# Patient Record
Sex: Male | Born: 1975 | Race: White | Hispanic: No | Marital: Single | Smoking: Current every day smoker
Health system: Southern US, Community
[De-identification: ages and names within clinical notes are randomized; demographics above are authoritative.]

## PROBLEM LIST (undated history)

## (undated) DIAGNOSIS — B182 Chronic viral hepatitis C: Secondary | ICD-10-CM

## (undated) HISTORY — PX: NO PAST SURGERIES: SHX2092

## (undated) HISTORY — DX: Chronic viral hepatitis C: B18.2

---

## 2000-06-03 ENCOUNTER — Emergency Department (HOSPITAL_COMMUNITY): Admission: EM | Admit: 2000-06-03 | Discharge: 2000-06-03 | Payer: Self-pay | Admitting: Emergency Medicine

## 2002-07-21 ENCOUNTER — Emergency Department (HOSPITAL_COMMUNITY): Admission: AC | Admit: 2002-07-21 | Discharge: 2002-07-21 | Payer: Self-pay

## 2003-11-30 ENCOUNTER — Emergency Department (HOSPITAL_COMMUNITY): Admission: EM | Admit: 2003-11-30 | Discharge: 2003-11-30 | Payer: Self-pay | Admitting: Emergency Medicine

## 2007-08-14 ENCOUNTER — Emergency Department (HOSPITAL_COMMUNITY): Admission: EM | Admit: 2007-08-14 | Discharge: 2007-08-14 | Payer: Self-pay | Admitting: Emergency Medicine

## 2010-05-21 ENCOUNTER — Emergency Department (HOSPITAL_COMMUNITY): Admission: EM | Admit: 2010-05-21 | Discharge: 2010-05-21 | Payer: Self-pay | Admitting: Emergency Medicine

## 2011-03-12 ENCOUNTER — Emergency Department (HOSPITAL_COMMUNITY): Payer: Self-pay

## 2011-03-12 ENCOUNTER — Emergency Department (HOSPITAL_COMMUNITY)
Admission: EM | Admit: 2011-03-12 | Discharge: 2011-03-12 | Disposition: A | Discharge status: Home / Self Care | Payer: No Typology Code available for payment source | Attending: Emergency Medicine | Admitting: Emergency Medicine

## 2011-03-12 DIAGNOSIS — F1010 Alcohol abuse, uncomplicated: Secondary | ICD-10-CM | POA: Insufficient documentation

## 2011-03-12 DIAGNOSIS — S0180XA Unspecified open wound of other part of head, initial encounter: Secondary | ICD-10-CM | POA: Insufficient documentation

## 2011-03-12 DIAGNOSIS — R51 Headache: Secondary | ICD-10-CM | POA: Insufficient documentation

## 2011-03-12 LAB — BASIC METABOLIC PANEL
Calcium: 9.3 mg/dL (ref 8.4–10.5)
GFR calc Af Amer: >60 mL/min (ref >60)
GFR calc non Af Amer: >60 mL/min (ref >60)
Potassium: 3.8 mEq/L (ref 3.5–5.1)
Sodium: 143 mEq/L (ref 135–145)

## 2011-03-12 LAB — CBC
HCT: 45.8 % (ref 39.0–52.0)
MCHC: 35.8 g/dL (ref 30.0–36.0)
RDW: 13 % (ref 11.5–15.5)
WBC: 24.5 10*3/uL — ABNORMAL HIGH (ref 4.0–10.5)

## 2011-03-12 LAB — ETHANOL: Alcohol, Ethyl (B): 215 mg/dL — ABNORMAL HIGH (ref 0–11)

## 2013-01-28 ENCOUNTER — Encounter (HOSPITAL_COMMUNITY): Payer: Self-pay | Admitting: Adult Health

## 2013-01-28 ENCOUNTER — Emergency Department (HOSPITAL_COMMUNITY)
Admission: EM | Admit: 2013-01-28 | Discharge: 2013-01-28 | Disposition: A | Discharge status: Home / Self Care | Payer: Self-pay | Attending: Emergency Medicine | Admitting: Emergency Medicine

## 2013-01-28 DIAGNOSIS — T24232A Burn of second degree of left lower leg, initial encounter: Secondary | ICD-10-CM

## 2013-01-28 DIAGNOSIS — F17200 Nicotine dependence, unspecified, uncomplicated: Secondary | ICD-10-CM | POA: Insufficient documentation

## 2013-01-28 DIAGNOSIS — T24131A Burn of first degree of right lower leg, initial encounter: Secondary | ICD-10-CM

## 2013-01-28 DIAGNOSIS — X19XXXA Contact with other heat and hot substances, initial encounter: Secondary | ICD-10-CM | POA: Insufficient documentation

## 2013-01-28 DIAGNOSIS — Y92009 Unspecified place in unspecified non-institutional (private) residence as the place of occurrence of the external cause: Secondary | ICD-10-CM | POA: Insufficient documentation

## 2013-01-28 DIAGNOSIS — Y939 Activity, unspecified: Secondary | ICD-10-CM | POA: Insufficient documentation

## 2013-01-28 DIAGNOSIS — T24239A Burn of second degree of unspecified lower leg, initial encounter: Secondary | ICD-10-CM | POA: Insufficient documentation

## 2013-01-28 MED ORDER — OXYCODONE-ACETAMINOPHEN 5-325 MG PO TABS
2.0000 | ORAL_TABLET | Freq: Once | ORAL | Status: AC
  Administered 2013-01-28: 2 via ORAL
  Filled 2013-01-28: qty 2

## 2013-01-28 MED ORDER — OXYCODONE-ACETAMINOPHEN 5-325 MG PO TABS: 1.0000 | ORAL_TABLET | ORAL | Status: DC | PRN
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
## 2013-01-30 NOTE — ED Provider Notes (Signed)
Medical screening examination/treatment/procedure(s) were performed by non-physician practitioner and as supervising physician I was immediately available for consultation/collaboration.   Anterrio Mccleery W. Srihith Aquilino, MD 01/30/13 0744 

## 2013-11-30 ENCOUNTER — Emergency Department (HOSPITAL_COMMUNITY)
Admission: EM | Admit: 2013-11-30 | Discharge: 2013-11-30 | Disposition: A | Discharge status: Home / Self Care | Payer: Self-pay | Attending: Emergency Medicine | Admitting: Emergency Medicine

## 2013-11-30 ENCOUNTER — Emergency Department (HOSPITAL_COMMUNITY): Payer: Self-pay

## 2013-11-30 DIAGNOSIS — D72829 Elevated white blood cell count, unspecified: Secondary | ICD-10-CM | POA: Insufficient documentation

## 2013-11-30 DIAGNOSIS — F17200 Nicotine dependence, unspecified, uncomplicated: Secondary | ICD-10-CM | POA: Insufficient documentation

## 2013-11-30 DIAGNOSIS — R7402 Elevation of levels of lactic acid dehydrogenase (LDH): Secondary | ICD-10-CM | POA: Insufficient documentation

## 2013-11-30 DIAGNOSIS — R7401 Elevation of levels of liver transaminase levels: Secondary | ICD-10-CM | POA: Insufficient documentation

## 2013-11-30 DIAGNOSIS — Y9389 Activity, other specified: Secondary | ICD-10-CM | POA: Insufficient documentation

## 2013-11-30 DIAGNOSIS — Z23 Encounter for immunization: Secondary | ICD-10-CM | POA: Insufficient documentation

## 2013-11-30 DIAGNOSIS — L02419 Cutaneous abscess of limb, unspecified: Secondary | ICD-10-CM | POA: Insufficient documentation

## 2013-11-30 DIAGNOSIS — IMO0002 Reserved for concepts with insufficient information to code with codable children: Secondary | ICD-10-CM | POA: Insufficient documentation

## 2013-11-30 DIAGNOSIS — L03116 Cellulitis of left lower limb: Secondary | ICD-10-CM

## 2013-11-30 DIAGNOSIS — L03119 Cellulitis of unspecified part of limb: Secondary | ICD-10-CM

## 2013-11-30 DIAGNOSIS — R000 Tachycardia, unspecified: Secondary | ICD-10-CM | POA: Insufficient documentation

## 2013-11-30 DIAGNOSIS — Y9289 Other specified places as the place of occurrence of the external cause: Secondary | ICD-10-CM | POA: Insufficient documentation

## 2013-11-30 DIAGNOSIS — R740 Nonspecific elevation of levels of transaminase and lactic acid dehydrogenase [LDH]: Secondary | ICD-10-CM

## 2013-11-30 DIAGNOSIS — W2203XA Walked into furniture, initial encounter: Secondary | ICD-10-CM | POA: Insufficient documentation

## 2013-11-30 LAB — BASIC METABOLIC PANEL
BUN: 9 mg/dL (ref 6–23)
CALCIUM: 9 mg/dL (ref 8.4–10.5)
CO2: 21 meq/L (ref 19–32)
CREATININE: 1.21 mg/dL (ref 0.50–1.35)
Chloride: 103 mEq/L (ref 96–112)
GFR calc Af Amer: 86 mL/min — ABNORMAL LOW (ref >90)
GFR calc non Af Amer: 74 mL/min — ABNORMAL LOW (ref >90)
Glucose, Bld: 110 mg/dL — ABNORMAL HIGH (ref 70–99)
Potassium: 3.8 mEq/L (ref 3.7–5.3)
Sodium: 142 mEq/L (ref 137–147)

## 2013-11-30 LAB — CBC WITH DIFFERENTIAL/PLATELET
BASOS ABS: 0 10*3/uL (ref 0.0–0.1)
BASOS PCT: 0 % (ref 0–1)
EOS PCT: 2 % (ref 0–5)
Eosinophils Absolute: 0.2 10*3/uL (ref 0.0–0.7)
HEMATOCRIT: 45.8 % (ref 39.0–52.0)
Hemoglobin: 16.3 g/dL (ref 13.0–17.0)
Lymphocytes Relative: 13 % (ref 12–46)
Lymphs Abs: 1.6 10*3/uL (ref 0.7–4.0)
MCH: 34.3 pg — ABNORMAL HIGH (ref 26.0–34.0)
MCHC: 35.6 g/dL (ref 30.0–36.0)
MCV: 96.4 fL (ref 78.0–100.0)
MONO ABS: 1 10*3/uL (ref 0.1–1.0)
Monocytes Relative: 8 % (ref 3–12)
Neutro Abs: 9.1 10*3/uL — ABNORMAL HIGH (ref 1.7–7.7)
Neutrophils Relative %: 77 % (ref 43–77)
Platelets: 306 10*3/uL (ref 150–400)
RBC: 4.75 MIL/uL (ref 4.22–5.81)
RDW: 12.5 % (ref 11.5–15.5)
WBC: 11.9 10*3/uL — ABNORMAL HIGH (ref 4.0–10.5)

## 2013-11-30 LAB — I-STAT CG4 LACTIC ACID, ED: Lactic Acid, Venous: 2.7 mmol/L — ABNORMAL HIGH (ref 0.5–2.2)

## 2013-11-30 MED ORDER — SODIUM CHLORIDE 0.9 % IV BOLUS (SEPSIS)
1000.0000 mL | Freq: Once | INTRAVENOUS | Status: AC
  Administered 2013-11-30: 1000 mL via INTRAVENOUS

## 2013-11-30 MED ORDER — CLINDAMYCIN HCL 150 MG PO CAPS: 300.0000 mg | ORAL_CAPSULE | Freq: Four times a day (QID) | ORAL | Status: DC

## 2013-11-30 MED ORDER — TETANUS-DIPHTH-ACELL PERTUSSIS 5-2.5-18.5 LF-MCG/0.5 IM SUSP
0.5000 mL | Freq: Once | INTRAMUSCULAR | Status: AC
  Administered 2013-11-30: 0.5 mL via INTRAMUSCULAR
  Filled 2013-11-30: qty 0.5

## 2013-11-30 MED ORDER — GI COCKTAIL ~~LOC~~
30.0000 mL | Freq: Once | ORAL | Status: AC
  Administered 2013-11-30: 30 mL via ORAL
  Filled 2013-11-30: qty 30

## 2013-11-30 MED ORDER — CLINDAMYCIN PHOSPHATE 600 MG/50ML IV SOLN
600.0000 mg | Freq: Once | INTRAVENOUS | Status: AC
  Administered 2013-11-30: 600 mg via INTRAVENOUS
  Filled 2013-11-30: qty 50

## 2013-11-30 MED ORDER — OXYCODONE-ACETAMINOPHEN 5-325 MG PO TABS: 2.0000 | ORAL_TABLET | ORAL | Status: DC | PRN

## 2013-11-30 MED ORDER — MORPHINE SULFATE 4 MG/ML IJ SOLN
4.0000 mg | Freq: Once | INTRAMUSCULAR | Status: AC
  Administered 2013-11-30: 4 mg via INTRAVENOUS
  Filled 2013-11-30: qty 1

## 2013-11-30 NOTE — ED Notes (Signed)
Pt reports left lower leg pain. Pt presents with wound on lower left leg that happened a week ago. Wound is red and warm and tender to touch. Pt A&Ox4, respirations equal and unlabored, skin warm and dry
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
## 2013-11-30 NOTE — Discharge Instructions (Signed)
Return to a doctor in 2 days for recheck of your wound.  Return sooner for signs of worsening infection:  Fever, increasing redness, drainage of pus, or pain uncontrolled with medications   Cellulitis Cellulitis is an infection of the skin and the tissue beneath it. The infected area is usually red and tender. Cellulitis occurs most often in the arms and lower legs.  CAUSES  Cellulitis is caused by bacteria that enter the skin through cracks or cuts in the skin. The most common types of bacteria that cause cellulitis are Staphylococcus and Streptococcus. SYMPTOMS   Redness and warmth.  Swelling.  Tenderness or pain.  Fever. DIAGNOSIS  Your caregiver can usually determine what is wrong based on a physical exam. Blood tests may also be done. TREATMENT  Treatment usually involves taking an antibiotic medicine. HOME CARE INSTRUCTIONS   Take your antibiotics as directed. Finish them even if you start to feel better.  Keep the infected arm or leg elevated to reduce swelling.  Apply a warm cloth to the affected area up to 4 times per day to relieve pain.  Only take over-the-counter or prescription medicines for pain, discomfort, or fever as directed by your caregiver.  Keep all follow-up appointments as directed by your caregiver. SEEK MEDICAL CARE IF:   You notice red streaks coming from the infected area.  Your red area gets larger or turns dark in color.  Your bone or joint underneath the infected area becomes painful after the skin has healed.  Your infection returns in the same area or another area.  You notice a swollen bump in the infected area.  You develop new symptoms. SEEK IMMEDIATE MEDICAL CARE IF:   You have a fever.  You feel very sleepy.  You develop vomiting or diarrhea.  You have a general ill feeling (malaise) with muscle aches and pains. MAKE SURE YOU:   Understand these instructions.  Will watch your condition.  Will get help right away if you  are not doing well or get worse. Document Released: 05/05/2005 Document Revised: 01/25/2012 Document Reviewed: 10/11/2011 [facility] Patient Information 2014 [employer], [place].

## 2013-12-06 LAB — CULTURE, BLOOD (ROUTINE X 2)
CULTURE: NO GROWTH
Culture: NO GROWTH

## 2016-09-27 ENCOUNTER — Other Ambulatory Visit: Payer: Self-pay | Admitting: Family Medicine

## 2016-09-27 ENCOUNTER — Ambulatory Visit
Admission: RE | Admit: 2016-09-27 | Discharge: 2016-09-27 | Disposition: A | Discharge status: Home / Self Care | Payer: BLUE CROSS/BLUE SHIELD | Source: Ambulatory Visit | Attending: Family Medicine | Admitting: Family Medicine

## 2016-09-27 DIAGNOSIS — R0789 Other chest pain: Secondary | ICD-10-CM

## 2017-01-04 ENCOUNTER — Other Ambulatory Visit: Payer: Self-pay | Admitting: Family Medicine

## 2017-01-04 DIAGNOSIS — R17 Unspecified jaundice: Secondary | ICD-10-CM

## 2017-01-04 DIAGNOSIS — R110 Nausea: Secondary | ICD-10-CM

## 2017-01-07 ENCOUNTER — Emergency Department (HOSPITAL_COMMUNITY)
Admission: EM | Admit: 2017-01-07 | Discharge: 2017-01-07 | Discharge status: Against Medical Advice | Payer: BLUE CROSS/BLUE SHIELD | Attending: Emergency Medicine | Admitting: Emergency Medicine

## 2017-01-07 ENCOUNTER — Encounter (HOSPITAL_COMMUNITY): Payer: Self-pay

## 2017-01-07 DIAGNOSIS — R1011 Right upper quadrant pain: Secondary | ICD-10-CM | POA: Diagnosis present

## 2017-01-07 LAB — COMPREHENSIVE METABOLIC PANEL
ALBUMIN: 3.2 g/dL — AB (ref 3.5–5.0)
ALK PHOS: 146 U/L — AB (ref 38–126)
ALT: 1456 U/L — AB (ref 17–63)
AST: 1148 U/L — AB (ref 15–41)
Anion gap: 8 (ref 5–15)
BUN: 6 mg/dL (ref 6–20)
CALCIUM: 8.8 mg/dL — AB (ref 8.9–10.3)
CO2: 25 mmol/L (ref 22–32)
CREATININE: 0.91 mg/dL (ref 0.61–1.24)
Chloride: 104 mmol/L (ref 101–111)
GFR calc non Af Amer: >60 mL/min (ref >60)
GLUCOSE: 178 mg/dL — AB (ref 65–99)
Potassium: 4.3 mmol/L (ref 3.5–5.1)
SODIUM: 137 mmol/L (ref 135–145)
Total Bilirubin: 12.1 mg/dL — ABNORMAL HIGH (ref 0.3–1.2)
Total Protein: 6.1 g/dL — ABNORMAL LOW (ref 6.5–8.1)

## 2017-01-07 LAB — CBC
HCT: 42.9 % (ref 39.0–52.0)
Hemoglobin: 14.5 g/dL (ref 13.0–17.0)
MCH: 30.6 pg (ref 26.0–34.0)
MCHC: 33.8 g/dL (ref 30.0–36.0)
MCV: 90.5 fL (ref 78.0–100.0)
PLATELETS: 265 10*3/uL (ref 150–400)
RBC: 4.74 MIL/uL (ref 4.22–5.81)
RDW: 15.6 % — ABNORMAL HIGH (ref 11.5–15.5)
WBC: 6.8 10*3/uL (ref 4.0–10.5)

## 2017-01-07 LAB — LIPASE, BLOOD: Lipase: 33 U/L (ref 11–51)

## 2017-01-07 NOTE — ED Triage Notes (Signed)
Pt complaining of R upper abdominal pain. Pt states seen by PCP, told having gallbladder issues. Told to come to ED. Pt states ongoing x months. Pt denies any new/worsening symtpoms today.

## 2017-01-07 NOTE — ED Notes (Signed)
Called pt to possibly give urine sample and no one answered.

## 2017-01-13 ENCOUNTER — Other Ambulatory Visit: Payer: BLUE CROSS/BLUE SHIELD

## 2019-02-16 ENCOUNTER — Other Ambulatory Visit: Payer: Self-pay

## 2019-02-16 ENCOUNTER — Emergency Department (HOSPITAL_COMMUNITY)
Admission: EM | Admit: 2019-02-16 | Discharge: 2019-02-16 | Disposition: A | Discharge status: Home / Self Care | Payer: BLUE CROSS/BLUE SHIELD | Attending: Emergency Medicine | Admitting: Emergency Medicine

## 2019-02-16 ENCOUNTER — Encounter (HOSPITAL_COMMUNITY): Payer: Self-pay

## 2019-02-16 DIAGNOSIS — F17210 Nicotine dependence, cigarettes, uncomplicated: Secondary | ICD-10-CM | POA: Insufficient documentation

## 2019-02-16 DIAGNOSIS — F17220 Nicotine dependence, chewing tobacco, uncomplicated: Secondary | ICD-10-CM | POA: Insufficient documentation

## 2019-02-16 DIAGNOSIS — F1910 Other psychoactive substance abuse, uncomplicated: Secondary | ICD-10-CM

## 2019-02-16 NOTE — ED Notes (Signed)
Patient is dressing out in scrubs.

## 2019-02-16 NOTE — ED Notes (Signed)
Patient given sandwich and soda. 

## 2019-02-16 NOTE — ED Notes (Signed)
Patient discharge and resources provided to patient.
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
## 2019-03-13 ENCOUNTER — Emergency Department (HOSPITAL_COMMUNITY)
Admission: EM | Admit: 2019-03-13 | Discharge: 2019-03-13 | Disposition: A | Discharge status: Home / Self Care | Payer: Self-pay | Attending: Emergency Medicine | Admitting: Emergency Medicine

## 2019-03-13 ENCOUNTER — Encounter (HOSPITAL_COMMUNITY): Payer: Self-pay | Admitting: Emergency Medicine

## 2019-03-13 ENCOUNTER — Emergency Department (HOSPITAL_COMMUNITY): Payer: Self-pay

## 2019-03-13 ENCOUNTER — Other Ambulatory Visit: Payer: Self-pay

## 2019-03-13 DIAGNOSIS — F1910 Other psychoactive substance abuse, uncomplicated: Secondary | ICD-10-CM

## 2019-03-13 DIAGNOSIS — T41294A Poisoning by other general anesthetics, undetermined, initial encounter: Secondary | ICD-10-CM | POA: Insufficient documentation

## 2019-03-13 DIAGNOSIS — R000 Tachycardia, unspecified: Secondary | ICD-10-CM | POA: Insufficient documentation

## 2019-03-13 DIAGNOSIS — Z20828 Contact with and (suspected) exposure to other viral communicable diseases: Secondary | ICD-10-CM | POA: Insufficient documentation

## 2019-03-13 DIAGNOSIS — F17210 Nicotine dependence, cigarettes, uncomplicated: Secondary | ICD-10-CM | POA: Insufficient documentation

## 2019-03-13 DIAGNOSIS — T50904A Poisoning by unspecified drugs, medicaments and biological substances, undetermined, initial encounter: Secondary | ICD-10-CM

## 2019-03-13 DIAGNOSIS — R456 Violent behavior: Secondary | ICD-10-CM | POA: Insufficient documentation

## 2019-03-13 DIAGNOSIS — R451 Restlessness and agitation: Secondary | ICD-10-CM | POA: Insufficient documentation

## 2019-03-13 LAB — CBC WITH DIFFERENTIAL/PLATELET
Abs Immature Granulocytes: 0.14 10*3/uL — ABNORMAL HIGH (ref 0.00–0.07)
Basophils Absolute: 0.1 10*3/uL (ref 0.0–0.1)
Basophils Relative: 1 %
Eosinophils Absolute: 0.3 10*3/uL (ref 0.0–0.5)
Eosinophils Relative: 2 %
HCT: 46.3 % (ref 39.0–52.0)
Hemoglobin: 15.5 g/dL (ref 13.0–17.0)
Immature Granulocytes: 1 %
Lymphocytes Relative: 14 %
Lymphs Abs: 1.8 10*3/uL (ref 0.7–4.0)
MCH: 31.5 pg (ref 26.0–34.0)
MCHC: 33.5 g/dL (ref 30.0–36.0)
MCV: 94.1 fL (ref 80.0–100.0)
Monocytes Absolute: 1 10*3/uL (ref 0.1–1.0)
Monocytes Relative: 8 %
Neutro Abs: 10 10*3/uL — ABNORMAL HIGH (ref 1.7–7.7)
Neutrophils Relative %: 74 %
Platelets: 317 10*3/uL (ref 150–400)
RBC: 4.92 MIL/uL (ref 4.22–5.81)
RDW: 12.8 % (ref 11.5–15.5)
WBC: 13.3 10*3/uL — ABNORMAL HIGH (ref 4.0–10.5)
nRBC: 0 % (ref 0.0–0.2)

## 2019-03-13 LAB — RAPID URINE DRUG SCREEN, HOSP PERFORMED
Amphetamines: POSITIVE — AB
Barbiturates: NOT DETECTED
Benzodiazepines: POSITIVE — AB
Cocaine: POSITIVE — AB
Opiates: NOT DETECTED
Tetrahydrocannabinol: NOT DETECTED

## 2019-03-13 LAB — I-STAT CHEM 8, ED
BUN: 15 mg/dL (ref 6–20)
Calcium, Ion: 1.1 mmol/L — ABNORMAL LOW (ref 1.15–1.40)
Chloride: 108 mmol/L (ref 98–111)
Creatinine, Ser: 1.2 mg/dL (ref 0.61–1.24)
Glucose, Bld: 98 mg/dL (ref 70–99)
HCT: 46 % (ref 39.0–52.0)
Hemoglobin: 15.6 g/dL (ref 13.0–17.0)
Potassium: 3.8 mmol/L (ref 3.5–5.1)
Sodium: 142 mmol/L (ref 135–145)
TCO2: 23 mmol/L (ref 22–32)

## 2019-03-13 LAB — COMPREHENSIVE METABOLIC PANEL
ALT: 59 U/L — ABNORMAL HIGH (ref 0–44)
AST: 55 U/L — ABNORMAL HIGH (ref 15–41)
Albumin: 4.8 g/dL (ref 3.5–5.0)
Alkaline Phosphatase: 64 U/L (ref 38–126)
Anion gap: 15 (ref 5–15)
BUN: 14 mg/dL (ref 6–20)
CO2: 23 mmol/L (ref 22–32)
Calcium: 9.6 mg/dL (ref 8.9–10.3)
Chloride: 104 mmol/L (ref 98–111)
Creatinine, Ser: 1.42 mg/dL — ABNORMAL HIGH (ref 0.61–1.24)
GFR calc Af Amer: >60 mL/min (ref >60)
GFR calc non Af Amer: 59 mL/min — ABNORMAL LOW (ref >60)
Glucose, Bld: 93 mg/dL (ref 70–99)
Potassium: 4.1 mmol/L (ref 3.5–5.1)
Sodium: 142 mmol/L (ref 135–145)
Total Bilirubin: 1.5 mg/dL — ABNORMAL HIGH (ref 0.3–1.2)
Total Protein: 7.9 g/dL (ref 6.5–8.1)

## 2019-03-13 LAB — SALICYLATE LEVEL: Salicylate Lvl: <7 mg/dL (ref 2.8–30.0)

## 2019-03-13 LAB — SARS CORONAVIRUS 2 BY RT PCR (HOSPITAL ORDER, PERFORMED IN ~~LOC~~ HOSPITAL LAB): SARS Coronavirus 2: NEGATIVE

## 2019-03-13 LAB — ACETAMINOPHEN LEVEL: Acetaminophen (Tylenol), Serum: <10 ug/mL — ABNORMAL LOW (ref 10–30)

## 2019-03-13 LAB — CBG MONITORING, ED: Glucose-Capillary: 97 mg/dL (ref 70–99)

## 2019-03-13 LAB — ETHANOL: Alcohol, Ethyl (B): <10 mg/dL (ref <10)

## 2019-03-13 LAB — CK: Total CK: 613 U/L — ABNORMAL HIGH (ref 49–397)

## 2019-03-13 MED ORDER — SODIUM CHLORIDE 0.9 % IV SOLN
INTRAVENOUS | Status: DC
  Administered 2019-03-13: 06:00:00 via INTRAVENOUS

## 2019-03-13 MED ORDER — LORAZEPAM 2 MG/ML IJ SOLN
2.0000 mg | Freq: Once | INTRAMUSCULAR | Status: AC
  Administered 2019-03-13: 06:00:00 2 mg via INTRAVENOUS

## 2019-03-13 MED ORDER — DIPHENHYDRAMINE HCL 50 MG/ML IJ SOLN
INTRAMUSCULAR | Status: AC
  Filled 2019-03-13: qty 1

## 2019-03-13 MED ORDER — LORAZEPAM 2 MG/ML IJ SOLN
INTRAMUSCULAR | Status: AC
  Filled 2019-03-13: qty 1

## 2019-03-13 MED ORDER — SODIUM CHLORIDE 0.9 % IV BOLUS
1000.0000 mL | Freq: Once | INTRAVENOUS | Status: AC
  Administered 2019-03-13: 06:00:00 1000 mL via INTRAVENOUS

## 2019-03-13 MED ORDER — STERILE WATER FOR INJECTION IJ SOLN
INTRAMUSCULAR | Status: AC
  Administered 2019-03-13: 06:00:00
  Filled 2019-03-13: qty 10

## 2019-03-13 MED ORDER — THIAMINE HCL 100 MG/ML IJ SOLN
Freq: Once | INTRAVENOUS | Status: AC
  Administered 2019-03-13: 08:00:00 via INTRAVENOUS
  Filled 2019-03-13: qty 1000

## 2019-03-13 MED ORDER — DIPHENHYDRAMINE HCL 50 MG/ML IJ SOLN
25.0000 mg | Freq: Once | INTRAMUSCULAR | Status: AC
  Administered 2019-03-13: 06:00:00 25 mg via INTRAVENOUS

## 2019-03-13 MED ORDER — ZIPRASIDONE MESYLATE 20 MG IM SOLR
20.0000 mg | Freq: Once | INTRAMUSCULAR | Status: AC
  Administered 2019-03-13: 20 mg via INTRAMUSCULAR

## 2019-03-13 NOTE — ED Notes (Signed)
Pharmacy notified to send pt.'s IV fluid order.
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
## 2019-03-13 NOTE — ED Notes (Signed)
Pt verbalized discharge instructions and the need for substance abuse assistance. Pt is alert and oriented X4. IV removed and bleeding controlled. Attempted to call parents for ride, could not reach them. Pt stated he prefers to walk home. Pt has steady gait and ambulated to lobby with no issues.

## 2019-03-13 NOTE — ED Provider Notes (Signed)
Pt signed out by Dr. Gustavus Messing.  He has been asleep all day.  He is now awake and alert.  He denies si or hi.  He is ready to go home.  His vitals are good.  He is stable for d/c.  Return if worse.   Isla Pence, MD 03/13/19 [phone]

## 2019-03-13 NOTE — ED Notes (Signed)
Pt sitting up in bed and moaning. Easily redirectable and following simple commands at this time. Eyes closed and not speaking.

## 2019-03-13 NOTE — ED Notes (Signed)
Patient transported to CT scan . 

## 2019-03-13 NOTE — ED Provider Notes (Signed)
7:29 AM Care assumed from Dr. Randal Buba.  At time of transfer care, patient is awaiting results of diagnostic labs and imaging.  Patient presented for excited delirium likely diagnosis of multiple drugs.  Patient was pacified with Geodon, Ativan, Benadryl overnight and was not intubated.  Plan of care will be to wait for the work-up results of his labs and imaging and then when the patient wakes like, reassess him for SI/HI/or other complaints.  CK has returned over 600, he is currently getting fluids and received a fluid bolus already.  Patient continue to rest for several hours.  On my reassessment he is still somnolent.  He will be allowed to metabolize further.  Care transferred to oncoming team to await reassessment.  Anticipate discharge after rehydration and reassessment.     Tegeler, Gwenyth Allegra, MD 03/13/19 [phone]

## 2019-03-13 NOTE — ED Notes (Signed)
Spoke to pt mother. Gave update.
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
## 2019-03-26 ENCOUNTER — Other Ambulatory Visit: Payer: Self-pay

## 2019-03-26 ENCOUNTER — Emergency Department (HOSPITAL_COMMUNITY)
Admission: EM | Admit: 2019-03-26 | Discharge: 2019-03-26 | Disposition: A | Discharge status: Home / Self Care | Payer: Self-pay | Attending: Emergency Medicine | Admitting: Emergency Medicine

## 2019-03-26 ENCOUNTER — Encounter (HOSPITAL_COMMUNITY): Payer: Self-pay

## 2019-03-26 DIAGNOSIS — Y929 Unspecified place or not applicable: Secondary | ICD-10-CM | POA: Insufficient documentation

## 2019-03-26 DIAGNOSIS — F22 Delusional disorders: Secondary | ICD-10-CM | POA: Insufficient documentation

## 2019-03-26 DIAGNOSIS — W25XXXA Contact with sharp glass, initial encounter: Secondary | ICD-10-CM | POA: Insufficient documentation

## 2019-03-26 DIAGNOSIS — F1910 Other psychoactive substance abuse, uncomplicated: Secondary | ICD-10-CM | POA: Insufficient documentation

## 2019-03-26 DIAGNOSIS — F419 Anxiety disorder, unspecified: Secondary | ICD-10-CM | POA: Insufficient documentation

## 2019-03-26 DIAGNOSIS — Y999 Unspecified external cause status: Secondary | ICD-10-CM | POA: Insufficient documentation

## 2019-03-26 DIAGNOSIS — T07XXXA Unspecified multiple injuries, initial encounter: Secondary | ICD-10-CM | POA: Insufficient documentation

## 2019-03-26 DIAGNOSIS — F17210 Nicotine dependence, cigarettes, uncomplicated: Secondary | ICD-10-CM | POA: Insufficient documentation

## 2019-03-26 DIAGNOSIS — Z046 Encounter for general psychiatric examination, requested by authority: Secondary | ICD-10-CM | POA: Insufficient documentation

## 2019-03-26 DIAGNOSIS — Y9389 Activity, other specified: Secondary | ICD-10-CM | POA: Insufficient documentation

## 2019-03-26 LAB — COMPREHENSIVE METABOLIC PANEL
ALT: 74 U/L — ABNORMAL HIGH (ref 0–44)
AST: 61 U/L — ABNORMAL HIGH (ref 15–41)
Albumin: 4.8 g/dL (ref 3.5–5.0)
Alkaline Phosphatase: 75 U/L (ref 38–126)
Anion gap: 13 (ref 5–15)
BUN: 16 mg/dL (ref 6–20)
CO2: 23 mmol/L (ref 22–32)
Calcium: 9.9 mg/dL (ref 8.9–10.3)
Chloride: 104 mmol/L (ref 98–111)
Creatinine, Ser: 1.62 mg/dL — ABNORMAL HIGH (ref 0.61–1.24)
GFR calc Af Amer: 59 mL/min — ABNORMAL LOW (ref >60)
GFR calc non Af Amer: 51 mL/min — ABNORMAL LOW (ref >60)
Glucose, Bld: 144 mg/dL — ABNORMAL HIGH (ref 70–99)
Potassium: 5.2 mmol/L — ABNORMAL HIGH (ref 3.5–5.1)
Sodium: 140 mmol/L (ref 135–145)
Total Bilirubin: 0.9 mg/dL (ref 0.3–1.2)
Total Protein: 8.1 g/dL (ref 6.5–8.1)

## 2019-03-26 LAB — RAPID URINE DRUG SCREEN, HOSP PERFORMED
Amphetamines: POSITIVE — AB
Barbiturates: NOT DETECTED
Benzodiazepines: NOT DETECTED
Cocaine: NOT DETECTED
Opiates: NOT DETECTED
Tetrahydrocannabinol: NOT DETECTED

## 2019-03-26 LAB — CBC
HCT: 48.3 % (ref 39.0–52.0)
Hemoglobin: 16.1 g/dL (ref 13.0–17.0)
MCH: 31.9 pg (ref 26.0–34.0)
MCHC: 33.3 g/dL (ref 30.0–36.0)
MCV: 95.6 fL (ref 80.0–100.0)
Platelets: 418 10*3/uL — ABNORMAL HIGH (ref 150–400)
RBC: 5.05 MIL/uL (ref 4.22–5.81)
RDW: 12.9 % (ref 11.5–15.5)
WBC: 12.6 10*3/uL — ABNORMAL HIGH (ref 4.0–10.5)
nRBC: 0 % (ref 0.0–0.2)

## 2019-03-26 LAB — ETHANOL: Alcohol, Ethyl (B): <10 mg/dL (ref <10)

## 2019-03-26 MED ORDER — LORAZEPAM 1 MG PO TABS: 1.0000 mg | ORAL_TABLET | Freq: Four times a day (QID) | ORAL | Status: DC | PRN
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
## 2019-03-26 NOTE — BH Assessment (Signed)
Tele Assessment Note   Patient Name: Reginald Lawson Eppinger MRN: [id number] Referring Physician: Gerhard MunchLockwood, Robert, MD Location of Patient: [facility] Location of Provider: Behavioral Health TTS Department  Reginald Lawson Oyola is an 43 y.o. male [facility] after experiencing substance induced psychotic break. Report he thought the [place] was out to kill him, however, he does not know if it really happened or not. Report history of substance induced psychotic break after using Meth. Report he last used Meth 5 or 6 hours ago as well as alcohol. Report history of cocaine use, last use 3 weeks ago. Report his psychotic breaks depends on how much sleep he has gotten before abusing Meth. Report he started abusing Meth (IV) at 43 year old. Report history of alcohol abuse. He has spent a total of 7-years in prison as a result of at less 7 DWI's charges. Report he does not have a drivers license.   Patient is 43 year old never married white male who employed. Report he lives alone and has one child. Report he has [facility]. Both times upon discharged report stayed clean 673-months before relapsing. Report history of cocaine/crack cocaine abuse staring in his early teens. Last use of cocaine/crack cocaine 3 or 4 weeks ago. Patient denied suicidal / homicidal ideations and presently denied auditory / visual hallucinations. Report he last used Meth(IV) and drank alcohol 5 or 6 hours ago.   Hillery Jacksanika Lewis, NP, recommend overnight observation on the observation unit.    Diagnosis: F15.222   Amphetamine or other stimulant intoxication, With perceptual disturbances, With moderate or severe use disorder                    F15.20     Amphetamine-type substance use disorder, Severe  Past Medical History: History reviewed. No pertinent past medical history.  History reviewed. No pertinent surgical history.  Family History: History  reviewed. No pertinent family history.  Social History:  reports that he has been smoking cigarettes. His smokeless tobacco use includes snuff. He reports current alcohol use. He reports current drug use. Drugs: IV and Methamphetamines.  Additional Social History:  Alcohol / Drug Use Pain Medications: see MAR Prescriptions: see MAR Over the Counter: see MAR History of alcohol / drug use?: Yes Substance #1 Name of Substance 1: Meth (ICE) IV drug 1 - Age of First Use: 41 1 - Amount (size/oz): varies 1 - Frequency: several times during the week 1 - Duration: 2-years 1 - Last Use / Amount: 03/25/2019 Substance #2 Name of Substance 2: Alcohol 2 - Age of First Use: [age] or 43 year-old, started heavy drinking around 43 year-old 2 - Amount (size/oz): varies 2 - Frequency: 2 or 3 times per week 2 - Duration: on-going 2 - Last Use / Amount: 03/25/2019 Substance #3 Name of Substance 3: Cocaine 3 - Age of First Use: 16 3 - Amount (size/oz): unknown 3 - Frequency: unknown 3 - Duration: ongoing 3 - Last Use / Amount: 3 / 4 weeks ago  CIWA: CIWA-Ar BP: (!) 137/104 Pulse Rate: (!) 125 COWS:    Allergies: No Known Allergies  Home Medications: (Not in a hospital admission)   OB/GYN Status:  No LMP for male patient.  General Assessment Data [facility] Assessment: In system Is this a Tele or Face-to-Face Assessment?: Tele Assessment Is this an Initial Assessment or a Re-assessment for this encounter?: Initial Assessment Patient Accompanied by:: N/A(Sheriff Office ) Language  Other than English: No Living Arrangements: Other (Comment)(alone ) What gender do you identify as?: Male Marital status: Single Pregnancy Status: No Living Arrangements: Alone Can pt return to current living arrangement?: Yes Admission Status: Voluntary Is patient capable of signing voluntary admission?: Yes Referral Source: Self/Family/Friend Insurance type: self pay     Crisis Care  Plan Living Arrangements: Alone Name of Psychiatrist: None report  Name of Therapist: none report   Education Status Is patient currently in school?: No Is the patient employed, unemployed or receiving disability?: Employed  Risk to self with the past 6 months Suicidal Ideation: No Has patient been a risk to self within the past 6 months prior to admission? : No Suicidal Intent: No Has patient had any suicidal intent within the past 6 months prior to admission? : No Is patient at risk for suicide?: No Suicidal Plan?: No Has patient had any suicidal plan within the past 6 months prior to admission? : No Access to Means: No What has been your use of drugs/alcohol within the last 12 months?: Meth, Alcohol and Cocaine  Previous Attempts/Gestures: No How many times?: 0 Other Self Harm Risks: none report  Triggers for Past Attempts: None known Intentional Self Injurious Behavior: Damaging(substance use ) Comment - Self Injurious Behavior: IV drug use  Family Suicide History: No Recent stressful life event(s): Other (Comment)(none report ) Persecutory voices/beliefs?: No Depression: No Depression Symptoms: (none report ) Substance abuse history and/or treatment for substance abuse?: No Suicide prevention information given to non-admitted patients: Not applicable  Risk to Others within the past 6 months Homicidal Ideation: No Does patient have any lifetime risk of violence toward others beyond the six months prior to admission? : No Thoughts of Harm to Others: No Current Homicidal Intent: No Current Homicidal Plan: No Access to Homicidal Means: No Identified Victim: n/a History of harm to others?: No Assessment of Violence: None Noted Violent Behavior Description: None Noted  Does patient have access to weapons?: No Criminal Charges Pending?: No Does patient have a court date: No Is patient on probation?: No  Psychosis Hallucinations: Auditory(substance induced psychotic  break ) Delusions: None noted  Mental Status Report Appearance/Hygiene: In scrubs Eye Contact: Good Motor Activity: Freedom of movement Speech: Logical/coherent Level of Consciousness: Alert Mood: Pleasant Affect: Appropriate to circumstance Anxiety Level: None Thought Processes: Coherent, Relevant Judgement: Unimpaired Orientation: Person, Place, Time, Situation Obsessive Compulsive Thoughts/Behaviors: None  Cognitive Functioning Concentration: Normal Memory: Recent Intact, Remote Intact Is patient IDD: No Insight: Good Impulse Control: Poor Appetite: Good Have you had any weight changes? : Loss Amount of the weight change? (lbs): 40 lbs(related to substance use ) Sleep: No Change Total Hours of Sleep: 7 Vegetative Symptoms: None  ADLScreening [facility] Assessment Services) Patient's cognitive ability adequate to safely complete daily activities?: Yes Patient able to express need for assistance with ADLs?: Yes Independently performs ADLs?: Yes (appropriate for developmental age)  Prior Inpatient Therapy Prior Inpatient Therapy: Yes Prior Therapy Facilty[facility]Provider[facility]): [facility]  Reason for Treatment: substance use   Prior Outpatient Therapy Prior Outpatient Therapy: No Does patient have an ACCT team?: No Does patient have Intensive In-House Services?  : No Does patient have [facility] services? : No Does patient have P4CC services?: No  ADL Screening (condition at time of admission) Patient's cognitive ability adequate to safely complete daily activities?: Yes Is the patient deaf or have difficulty hearing?: No Does the patient have difficulty seeing, even when wearing glasses/contacts?: No Does the patient have difficulty concentrating, remembering,  or making decisions?: No Patient able to express need for assistance with ADLs?: Yes Does the patient have difficulty dressing or bathing?: No Independently performs ADLs?: Yes (appropriate for developmental age) Does  the patient have difficulty walking or climbing stairs?: No       Abuse/Neglect Assessment (Assessment to be complete while patient is alone) Abuse/Neglect Assessment Can Be Completed: Yes Physical Abuse: Denies Verbal Abuse: Denies Sexual Abuse: Denies Exploitation of patient/patient's resources: Denies Self-Neglect: Denies     Merchant navy officerAdvance Directives (For Healthcare) Does Patient Have a Medical Advance Directive?: No Does patient want to make changes to medical advance directive?: No - Patient declined          Disposition:  Disposition Initial Assessment Completed for this Encounter: Yes(Tanika Lewis,NP, recommend overnight obs on observation unit)   Telisa Ohlsen [facility] 03/26/2019 12:08 PM
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
## 2019-03-26 NOTE — Discharge Instructions (Addendum)
Although you have elected to leave prior to staying for further evaluation, monitoring, management, do not hesitate to return should anything change. Return here for concerning changes in your condition.

## 2019-03-26 NOTE — BHH Counselor (Signed)
Ricky Ala, NP, recommend overnight observation on the observation unit at [facility]

## 2019-03-26 NOTE — ED Notes (Signed)
Pt changed into scrubs and belongings inventoried

## 2019-03-26 NOTE — ED Notes (Signed)
Pt stated he wanted to leave AMA. MD made aware. Belongings returned to pt

## 2019-03-26 NOTE — ED Notes (Signed)
TTS set up for pt 

## 2019-04-04 ENCOUNTER — Other Ambulatory Visit: Payer: Self-pay

## 2019-04-04 ENCOUNTER — Emergency Department (HOSPITAL_COMMUNITY)
Admission: EM | Admit: 2019-04-04 | Discharge: 2019-04-04 | Disposition: A | Discharge status: Home / Self Care | Payer: Self-pay | Attending: Emergency Medicine | Admitting: Emergency Medicine

## 2019-04-04 DIAGNOSIS — F1910 Other psychoactive substance abuse, uncomplicated: Secondary | ICD-10-CM | POA: Insufficient documentation

## 2019-04-04 DIAGNOSIS — F17210 Nicotine dependence, cigarettes, uncomplicated: Secondary | ICD-10-CM | POA: Insufficient documentation

## 2019-04-04 DIAGNOSIS — R451 Restlessness and agitation: Secondary | ICD-10-CM | POA: Insufficient documentation

## 2019-04-04 LAB — CBC WITH DIFFERENTIAL/PLATELET
Abs Immature Granulocytes: 0.03 10*3/uL (ref 0.00–0.07)
Basophils Absolute: 0.1 10*3/uL (ref 0.0–0.1)
Basophils Relative: 1 %
Eosinophils Absolute: 0.4 10*3/uL (ref 0.0–0.5)
Eosinophils Relative: 4 %
HCT: 44.3 % (ref 39.0–52.0)
Hemoglobin: 15.1 g/dL (ref 13.0–17.0)
Immature Granulocytes: 0 %
Lymphocytes Relative: 24 %
Lymphs Abs: 2.6 10*3/uL (ref 0.7–4.0)
MCH: 32.1 pg (ref 26.0–34.0)
MCHC: 34.1 g/dL (ref 30.0–36.0)
MCV: 94.3 fL (ref 80.0–100.0)
Monocytes Absolute: 0.9 10*3/uL (ref 0.1–1.0)
Monocytes Relative: 8 %
Neutro Abs: 6.7 10*3/uL (ref 1.7–7.7)
Neutrophils Relative %: 63 %
Platelets: 334 10*3/uL (ref 150–400)
RBC: 4.7 MIL/uL (ref 4.22–5.81)
RDW: 12.3 % (ref 11.5–15.5)
WBC: 10.7 10*3/uL — ABNORMAL HIGH (ref 4.0–10.5)
nRBC: 0 % (ref 0.0–0.2)

## 2019-04-04 LAB — COMPREHENSIVE METABOLIC PANEL
ALT: 57 U/L — ABNORMAL HIGH (ref 0–44)
AST: 51 U/L — ABNORMAL HIGH (ref 15–41)
Albumin: 4.4 g/dL (ref 3.5–5.0)
Alkaline Phosphatase: 68 U/L (ref 38–126)
Anion gap: 14 (ref 5–15)
BUN: 10 mg/dL (ref 6–20)
CO2: 19 mmol/L — ABNORMAL LOW (ref 22–32)
Calcium: 9.1 mg/dL (ref 8.9–10.3)
Chloride: 103 mmol/L (ref 98–111)
Creatinine, Ser: 1.04 mg/dL (ref 0.61–1.24)
GFR calc Af Amer: >60 mL/min (ref >60)
GFR calc non Af Amer: >60 mL/min (ref >60)
Glucose, Bld: 87 mg/dL (ref 70–99)
Potassium: 4.9 mmol/L (ref 3.5–5.1)
Sodium: 136 mmol/L (ref 135–145)
Total Bilirubin: 1.4 mg/dL — ABNORMAL HIGH (ref 0.3–1.2)
Total Protein: 7.3 g/dL (ref 6.5–8.1)

## 2019-04-04 LAB — RAPID URINE DRUG SCREEN, HOSP PERFORMED
Amphetamines: POSITIVE — AB
Barbiturates: NOT DETECTED
Benzodiazepines: NOT DETECTED
Cocaine: NOT DETECTED
Opiates: NOT DETECTED
Tetrahydrocannabinol: NOT DETECTED

## 2019-04-04 LAB — ETHANOL: Alcohol, Ethyl (B): <10 mg/dL (ref <10)

## 2019-04-04 MED ORDER — LORAZEPAM 1 MG PO TABS
1.0000 mg | ORAL_TABLET | Freq: Once | ORAL | Status: AC
  Administered 2019-04-04: 1 mg via ORAL
  Filled 2019-04-04: qty 1

## 2019-04-04 MED ORDER — LORAZEPAM 1 MG PO TABS
1.0000 mg | ORAL_TABLET | Freq: Once | ORAL | Status: AC
  Administered 2019-04-04: 15:00:00 1 mg via ORAL
  Filled 2019-04-04: qty 1

## 2019-04-04 MED ORDER — NICOTINE 21 MG/24HR TD PT24
21.0000 mg | MEDICATED_PATCH | Freq: Once | TRANSDERMAL | Status: DC
  Filled 2019-04-04: qty 1

## 2019-04-04 NOTE — BHH Counselor (Signed)
  [place] ASSESSMENT DISPOSITION  [facility] discussed case with [place] Provider, Mordecai Maes, NP who recommends discharge with community MH/SA resources.  CSW faxed the appropriate resources.  Reine Bristow L. [place], [place], [facility], [facility] Therapeutic Triage Specialist  [phone]
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
## 2019-04-04 NOTE — ED Notes (Signed)
TTS in progress 

## 2019-04-04 NOTE — ED Provider Notes (Signed)
[facility] EMERGENCY DEPARTMENT Provider Note   CSN: [id number] Arrival date & time: 04/04/19  0801     History   Chief Complaint Chief Complaint  Patient presents with  . Medical Clearance    HPI Reginald Lawson is a 43 y.o. male.     HPI Patient tells RN he wants help getting off drugs and alcohol.  When asked why the patient is in the emergency department states he does not know.  Admits to taking "dope".  Will not specify.  He denies any homicidal or suicidal ideation.  Seems agitated and hyperkinetic. No past medical history on file.  There are no active problems to display for this patient.   No past surgical history on file.      Home Medications    Prior to Admission medications   Not on File    Family History No family history on file.  Social History Social History   Tobacco Use  . Smoking status: Current Every Day Smoker    Types: Cigarettes  . Smokeless tobacco: Current User    Types: Snuff  Substance Use Topics  . Alcohol use: Yes  . Drug use: Yes    Types: IV, Methamphetamines     Allergies   Patient has no known allergies.   Review of Systems Review of Systems  Unable to perform ROS: Psychiatric disorder  Psychiatric/Behavioral: Positive for agitation. Negative for suicidal ideas.     Physical Exam Updated Vital Signs BP (!) 150/97   Pulse 80   Temp 98 F (36.7 C) (Oral)   Resp 17   Ht 5\' 6"  (1.676 m)   Wt 72.6 kg   SpO2 98%   BMI 25.82 kg/m   Physical Exam Vitals signs and nursing note reviewed.  Constitutional:      Appearance: He is well-developed.     Comments: Pacing around room.  HENT:     Head: Normocephalic and atraumatic.     Nose: Nose normal.  Eyes:     Pupils: Pupils are equal, round, and reactive to light.  Neck:     Musculoskeletal: Normal range of motion and neck supple. No neck rigidity.  Cardiovascular:     Rate and Rhythm: Normal rate and regular rhythm.  Pulmonary:   Effort: Pulmonary effort is normal.     Breath sounds: Normal breath sounds.  Abdominal:     General: Bowel sounds are normal.     Palpations: Abdomen is soft.     Tenderness: There is no abdominal tenderness. There is no guarding or rebound.  Musculoskeletal: Normal range of motion.        General: No tenderness.  Skin:    General: Skin is warm and dry.     Findings: No erythema or rash.  Neurological:     General: No focal deficit present.     Mental Status: He is alert and oriented to person, place, and time.  Psychiatric:     Comments: Mildly agitated.  Denies SI/HI.      ED Treatments / Results  Labs (all labs ordered are listed, but only abnormal results are displayed) Labs Reviewed  CBC WITH DIFFERENTIAL/PLATELET - Abnormal; Notable for the following components:      Result Value   WBC 10.7 (*)    All other components within normal limits  COMPREHENSIVE METABOLIC PANEL - Abnormal; Notable for the following components:   CO2 19 (*)    AST 51 (*)    ALT 57 (*)  Total Bilirubin 1.4 (*)    All other components within normal limits  RAPID URINE DRUG SCREEN, HOSP PERFORMED - Abnormal; Notable for the following components:   Amphetamines POSITIVE (*)    All other components within normal limits  ETHANOL    EKG None  Radiology No results found.  Procedures Procedures (including critical care time)  Medications Ordered in ED Medications  nicotine (NICODERM CQ - dosed in mg/24 hours) patch 21 mg (21 mg Transdermal Refused 04/04/19 1027)  LORazepam (ATIVAN) tablet 1 mg (1 mg Oral Given 04/04/19 1141)  LORazepam (ATIVAN) tablet 1 mg (1 mg Oral Given 04/04/19 1436)     Initial Impression / Assessment and Plan / ED Course  I have reviewed the triage vital signs and the nursing notes.  Pertinent labs & imaging results that were available during my care of the patient were reviewed by me and considered in my medical decision making (see chart for details).         Suspect patient is acutely intoxicated with stimulant.  Similar presentation 9 days ago.  Offered psychiatric evaluation but patient is unsure whether he wants any further help.  Does not appear to be acute threat to himself or others at this point.  Will continue to observe in the emergency department.  Patient is now requesting help with substance abuse.  Accepted p.o. Ativan.  Laboratory work-up without concerning findings.  Medically cleared for psychiatric evaluation. Final Clinical Impressions(s) / ED Diagnoses   Final diagnoses:  Polysubstance abuse [facility](HCC)    ED Discharge Orders    None       Loren RacerYelverton, Esbeidy Mclaine, MD 04/04/19 1455

## 2019-04-04 NOTE — Progress Notes (Signed)
Patient ID: Reginald Lawson, male   DOB: 08/19/1975, 43 y.o.   MRN: [id number]   In brief, patient was seen in the ED, voluntarily with complaints of wanting to get off drugs and alcohol. During this evaluation, he is alert and oriented x4, calm and cooperative.  He states that he wants to get off alcohol and admits to heavy drinking although he ethanol level was negative. He initially stated he was not using any drugs although when told that his UDS was positive for amphetamines, he admitted to using meth 6-7 hours prior to going to the hospital. He denies any withdrawal. He states that he has been to at least 2 rehabs in the past and we discussed he felts as though he needed resources for another rehab. He stated, " I already have enough resources. I just want to know if you would help me with any medications like Xanax or Valium."  Per counselor, patient also asked her for benzodiazepines. He denies active or passive suicidal thoughts, homicidal ideas or hallucinations.. There is no evidence of imminent risk to self or others at present.  Patient does not meet criteria for psychiatric inpatient admission. Patient report he does not have outpatient therapy or psychiatry, CSW will fax over resources for establishment as well as outpatient/inpatient resources for substance abuse.     Called twice to update ED on current disposition although nurse or EDP was unable to be reached.

## 2019-04-04 NOTE — ED Notes (Signed)
Pt wanded by security. 

## 2019-04-04 NOTE — ED Notes (Addendum)
Pt found crawling on floor looking at baseboards, when asked what he was doing he informed this RN that he was "trying to figure out why they were bent". Pt instructed to return to bed or recliner due to infection risk of crawling on floor.

## 2019-04-04 NOTE — ED Notes (Signed)
Patient Alert and oriented to baseline. Stable and ambulatory to baseline. Patient verbalized understanding of the discharge instructions.  Patient belongings were taken by the patient. Items stored in security were returned to pt at time of discharge. Inventory sheets placed in inventory binder.

## 2019-04-04 NOTE — ED Provider Notes (Signed)
PT evaluated by [facility] and per NP Marcello Moores with psychiatry, doesn't meet inpatient criteria. Discharged w/ outpatient resources.   Reginald Lawson, Wenda Overland, MD 04/04/19 2110

## 2019-04-04 NOTE — Progress Notes (Signed)
CSW faxed outpatient SA/MH resources to pt at [facility] ED.   Audree Camel, LCSW, [place] Disposition [facility] BHH/TTS [phone]
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
## 2019-11-18 ENCOUNTER — Other Ambulatory Visit: Payer: Self-pay

## 2019-11-18 ENCOUNTER — Encounter (HOSPITAL_COMMUNITY): Payer: Self-pay

## 2019-11-18 ENCOUNTER — Emergency Department (HOSPITAL_COMMUNITY)
Admission: EM | Admit: 2019-11-18 | Discharge: 2019-11-18 | Discharge status: Against Medical Advice | Payer: Self-pay | Attending: Emergency Medicine | Admitting: Emergency Medicine

## 2019-11-18 DIAGNOSIS — F1510 Other stimulant abuse, uncomplicated: Secondary | ICD-10-CM | POA: Insufficient documentation

## 2019-11-18 DIAGNOSIS — F17210 Nicotine dependence, cigarettes, uncomplicated: Secondary | ICD-10-CM | POA: Insufficient documentation

## 2019-11-18 MED ORDER — LORAZEPAM 2 MG/ML IJ SOLN
2.0000 mg | Freq: Once | INTRAMUSCULAR | Status: AC
  Administered 2019-11-18: 2 mg via INTRAMUSCULAR
  Filled 2019-11-18: qty 1

## 2019-11-18 NOTE — ED Provider Notes (Signed)
[facility] DEPT Provider Note   CSN: [id number] Arrival date & time: 11/18/19  1108   History Chief Complaint  Patient presents with  . Detox    Reginald Lawson is a 44 y.o. male who presents with meth intoxication. The patient states that he used 1.5gm of "ice" this morning. He states he came to the ED because he needed to "get out of the house" and "get away from the [place]" who are his neighbors. He has longstanding hx of polysubstance abuse. He reports drinking a "couple beers" as well but denies any other drug use. He has gone to rehab multiple times and states he knows what he needs to do he just wants help calming down. He states he hears voices and ringing in his ears at times. He states the [facility] electrical board is after him. He denies SI/HI. His mom is at bedside and states that he's been very paranoid and delusional at home  LEVEL 5 caveat due to excited delirium  HPI   History reviewed. No pertinent past medical history.  There are no problems to display for this patient.   History reviewed. No pertinent surgical history.     History reviewed. No pertinent family history.  Social History   Tobacco Use  . Smoking status: Current Every Day Smoker    Types: Cigarettes  . Smokeless tobacco: Current User    Types: Snuff  Substance Use Topics  . Alcohol use: Yes  . Drug use: Yes    Types: IV, Methamphetamines    Home Medications Prior to Admission medications   Not on File    Allergies    Patient has no known allergies.  Review of Systems   Review of Systems  Unable to perform ROS: Psychiatric disorder    Physical Exam Updated Vital Signs BP (!) 107/91 (BP Location: Right Arm)   Pulse (!) 126   Temp 98 F (36.7 C) (Oral)   Resp 16   SpO2 95%   Physical Exam Vitals and nursing note reviewed.  Constitutional:      General: He is not in acute distress.    Appearance: Normal appearance. He is well-developed. He is  not ill-appearing.  HENT:     Head: Normocephalic and atraumatic.  Eyes:     General: No scleral icterus.       Right eye: No discharge.        Left eye: No discharge.     Conjunctiva/sclera: Conjunctivae normal.     Pupils: Pupils are equal, round, and reactive to light.  Cardiovascular:     Rate and Rhythm: Tachycardia present.  Pulmonary:     Effort: Pulmonary effort is normal. No respiratory distress.  Abdominal:     General: There is no distension.  Musculoskeletal:     Cervical back: Normal range of motion.  Skin:    General: Skin is warm and dry.  Neurological:     Mental Status: He is alert and oriented to person, place, and time.  Psychiatric:        Attention and Perception: Attention normal.        Mood and Affect: Mood is anxious.        Behavior: Behavior is hyperactive. Behavior is cooperative.        Thought Content: Thought content is paranoid and delusional. Thought content does not include homicidal or suicidal ideation. Thought content does not include homicidal or suicidal plan.        Cognition and Memory:  Cognition normal.        Judgment: Judgment normal.     ED Results / Procedures / Treatments   Labs (all labs ordered are listed, but only abnormal results are displayed) Labs Reviewed - No data to display  EKG None  Radiology No results found.  Procedures Procedures (including critical care time)  Medications Ordered in ED Medications  LORazepam (ATIVAN) injection 2 mg (2 mg Intramuscular Given 11/18/19 1219)    ED Course  I have reviewed the triage vital signs and the nursing notes.  Pertinent labs & imaging results that were available during my care of the patient were reviewed by me and considered in my medical decision making (see chart for details).  44 year old male presents with hyperactivity and agitation after using meth this morning.  Patient is significantly tachycardic and restless on my evaluation.  He is denying SI or HI.   Will give dose of Ativan and reassess  Informed by nursing that the patient wants to leave AMA.  I did not a chance to talk to the patient before he left.  On my initial exam exam he did mention that he "knows what he needs to do" in regards to his drug use.  I do not think he needs to be IVC'd because he denies SI or HI.  He has some vague hallucinations and paranoia but does not seem to be a danger to himself or others at this time.  MDM Rules/Calculators/A&P                       Final Clinical Impression(s) / ED Diagnoses Final diagnoses:  Methamphetamine abuse [facility])    Rx / DC Orders ED Discharge Orders    None       Bethel Born, PA-C 11/18/19 1302    Bethann Berkshire, MD 11/19/19 [phone]

## 2019-11-18 NOTE — ED Triage Notes (Signed)
Pt presents with c/o withdrawal from meth. Pt reports he used "a lot" of meth a few hours ago. Pt is tachycardic, has scratches all over his arms, is incredibly anxious and moving all over the chair that he is sitting on.

## 2019-11-18 NOTE — ED Notes (Signed)
Family at bedside. 

## 2019-11-18 NOTE — ED Notes (Addendum)
Immediately after Ativan injection, pt states that he would like to leave.  Pt was informed that he would have to leave AMA since the PA would like to re-check his HR.  Despite encouragement to stay and explaining the risks of leaving, pt signed AMA and left the department.

## 2019-12-02 ENCOUNTER — Encounter (HOSPITAL_COMMUNITY): Payer: Self-pay | Admitting: Emergency Medicine

## 2019-12-02 ENCOUNTER — Emergency Department (HOSPITAL_COMMUNITY): Payer: Self-pay

## 2019-12-02 ENCOUNTER — Emergency Department (HOSPITAL_COMMUNITY)
Admission: EM | Admit: 2019-12-02 | Discharge: 2019-12-02 | Disposition: A | Discharge status: Home / Self Care | Payer: Self-pay | Attending: Emergency Medicine | Admitting: Emergency Medicine

## 2019-12-02 DIAGNOSIS — R000 Tachycardia, unspecified: Secondary | ICD-10-CM | POA: Insufficient documentation

## 2019-12-02 DIAGNOSIS — R1013 Epigastric pain: Secondary | ICD-10-CM | POA: Insufficient documentation

## 2019-12-02 DIAGNOSIS — R110 Nausea: Secondary | ICD-10-CM | POA: Insufficient documentation

## 2019-12-02 DIAGNOSIS — F17210 Nicotine dependence, cigarettes, uncomplicated: Secondary | ICD-10-CM | POA: Insufficient documentation

## 2019-12-02 DIAGNOSIS — F1510 Other stimulant abuse, uncomplicated: Secondary | ICD-10-CM | POA: Insufficient documentation

## 2019-12-02 DIAGNOSIS — R1010 Upper abdominal pain, unspecified: Secondary | ICD-10-CM

## 2019-12-02 LAB — CBC WITH DIFFERENTIAL/PLATELET
Abs Immature Granulocytes: 0.05 10*3/uL (ref 0.00–0.07)
Basophils Absolute: 0.1 10*3/uL (ref 0.0–0.1)
Basophils Relative: 1 %
Eosinophils Absolute: 0.2 10*3/uL (ref 0.0–0.5)
Eosinophils Relative: 1 %
HCT: 45.2 % (ref 39.0–52.0)
Hemoglobin: 15.6 g/dL (ref 13.0–17.0)
Immature Granulocytes: 0 %
Lymphocytes Relative: 15 %
Lymphs Abs: 2.2 10*3/uL (ref 0.7–4.0)
MCH: 32 pg (ref 26.0–34.0)
MCHC: 34.5 g/dL (ref 30.0–36.0)
MCV: 92.8 fL (ref 80.0–100.0)
Monocytes Absolute: 1.2 10*3/uL — ABNORMAL HIGH (ref 0.1–1.0)
Monocytes Relative: 8 %
Neutro Abs: 11 10*3/uL — ABNORMAL HIGH (ref 1.7–7.7)
Neutrophils Relative %: 75 %
Platelets: 348 10*3/uL (ref 150–400)
RBC: 4.87 MIL/uL (ref 4.22–5.81)
RDW: 12.7 % (ref 11.5–15.5)
WBC: 14.7 10*3/uL — ABNORMAL HIGH (ref 4.0–10.5)
nRBC: 0 % (ref 0.0–0.2)

## 2019-12-02 LAB — COMPREHENSIVE METABOLIC PANEL
ALT: 79 U/L — ABNORMAL HIGH (ref 0–44)
AST: 64 U/L — ABNORMAL HIGH (ref 15–41)
Albumin: 4.4 g/dL (ref 3.5–5.0)
Alkaline Phosphatase: 73 U/L (ref 38–126)
Anion gap: 10 (ref 5–15)
BUN: 14 mg/dL (ref 6–20)
CO2: 25 mmol/L (ref 22–32)
Calcium: 9.7 mg/dL (ref 8.9–10.3)
Chloride: 110 mmol/L (ref 98–111)
Creatinine, Ser: 1.43 mg/dL — ABNORMAL HIGH (ref 0.61–1.24)
GFR calc Af Amer: >60 mL/min (ref >60)
GFR calc non Af Amer: 59 mL/min — ABNORMAL LOW (ref >60)
Glucose, Bld: 94 mg/dL (ref 70–99)
Potassium: 4.1 mmol/L (ref 3.5–5.1)
Sodium: 145 mmol/L (ref 135–145)
Total Bilirubin: 1.2 mg/dL (ref 0.3–1.2)
Total Protein: 6.9 g/dL (ref 6.5–8.1)

## 2019-12-02 LAB — SALICYLATE LEVEL: Salicylate Lvl: <7 mg/dL — ABNORMAL LOW (ref 7.0–30.0)

## 2019-12-02 LAB — LIPASE, BLOOD: Lipase: 23 U/L (ref 11–51)

## 2019-12-02 LAB — ETHANOL: Alcohol, Ethyl (B): <10 mg/dL (ref <10)

## 2019-12-02 LAB — ACETAMINOPHEN LEVEL: Acetaminophen (Tylenol), Serum: <10 ug/mL — ABNORMAL LOW (ref 10–30)

## 2019-12-02 MED ORDER — SODIUM CHLORIDE 0.9 % IV BOLUS
1000.0000 mL | Freq: Once | INTRAVENOUS | Status: AC
  Administered 2019-12-02: 21:00:00 1000 mL via INTRAVENOUS

## 2019-12-02 MED ORDER — LORAZEPAM 2 MG/ML IJ SOLN
1.0000 mg | Freq: Once | INTRAMUSCULAR | Status: AC
  Administered 2019-12-02: 1 mg via INTRAVENOUS
  Filled 2019-12-02: qty 1

## 2019-12-02 NOTE — ED Notes (Signed)
Pt extremely anxious. Refuses to keep on EKG leads, BP cuff, SPO2 monitor. Rapid speech. Fidgeting in all extremities constantly

## 2019-12-02 NOTE — ED Notes (Signed)
This nurse entered the room to attempt to gather 2200 vitals and pt was standing beside bedside, extremely anxious without shirt and fidgeting with medical devices. Pt then ripped out IV and began to bleed. Pt said he was ready to leave and I advised him of risks of leaving AMA to include serious bodily harm and/or death. Pt agreed to signing AMA. MD came in to tell pt results of x-ray and blood and pt said okay, signed AMA form, and left.

## 2019-12-02 NOTE — ED Triage Notes (Signed)
Patient presents from home via ems, awake and alert x 4, restless, anxious, agitated, admits using methamphetamine early morning today. Patient relates eating food and experiencing abdominal pain associated with nausea today.

## 2019-12-02 NOTE — ED Provider Notes (Signed)
[facility] EMERGENCY DEPARTMENT Provider Note   CSN: [id number] Arrival date & time: 12/02/19  1731     History Chief Complaint  Patient presents with  . Abdominal Pain  . Nausea    Reginald Lawson is a 44 y.o. male.  Patient presents with epigastric pain and nausea throughout today.  Patient admits to using methamphetamine this morning.  Patient feels this possibly to his food.  Patient feels it might be needles in his stomach.  Patient admits to small amount alcohol use.  Patient denies abdominal surgeries or significant medical history except for hepatitis C.        History reviewed. No pertinent past medical history.  There are no problems to display for this patient.   History reviewed. No pertinent surgical history.     History reviewed. No pertinent family history.  Social History   Tobacco Use  . Smoking status: Current Every Day Smoker    Types: Cigarettes  . Smokeless tobacco: Current User    Types: Snuff  Substance Use Topics  . Alcohol use: Yes  . Drug use: Yes    Types: IV, Methamphetamines    Home Medications Prior to Admission medications   Not on File    Allergies    Patient has no known allergies.  Review of Systems   Review of Systems  Constitutional: Negative for chills and fever.  HENT: Negative for congestion.   Eyes: Negative for visual disturbance.  Respiratory: Negative for shortness of breath.   Cardiovascular: Negative for chest pain.  Gastrointestinal: Positive for abdominal pain and nausea. Negative for vomiting.  Genitourinary: Negative for dysuria and flank pain.  Musculoskeletal: Negative for back pain, neck pain and neck stiffness.  Skin: Negative for rash.  Neurological: Negative for light-headedness and headaches.  Psychiatric/Behavioral: The patient is nervous/anxious.     Physical Exam Updated Vital Signs BP 122/88 (BP Location: Right Arm)   Pulse 98   Temp 98.9 F (37.2 C) (Oral)   Resp  16   Ht 5\' 10"  (1.778 m)   Wt 77.1 kg   SpO2 96%   BMI 24.39 kg/m   Physical Exam Vitals and nursing note reviewed.  Constitutional:      Appearance: He is well-developed.  HENT:     Head: Normocephalic and atraumatic.     Comments: Dry mucous membrane Eyes:     General:        Right eye: No discharge.        Left eye: No discharge.     Conjunctiva/sclera: Conjunctivae normal.  Neck:     Trachea: No tracheal deviation.  Cardiovascular:     Rate and Rhythm: Regular rhythm. Tachycardia present.  Pulmonary:     Effort: Pulmonary effort is normal.     Breath sounds: Normal breath sounds.  Abdominal:     General: There is no distension.     Palpations: Abdomen is soft.     Tenderness: There is no abdominal tenderness. There is no guarding.  Musculoskeletal:     Cervical back: Normal range of motion and neck supple.  Skin:    General: Skin is warm.     Findings: No rash.  Neurological:     Mental Status: He is alert and oriented to person, place, and time.  Psychiatric:        Mood and Affect: Mood is anxious.        Speech: Speech normal.        Thought Content: Thought content  is paranoid and delusional.     Comments: Patient is paranoid     ED Results / Procedures / Treatments   Labs (all labs ordered are listed, but only abnormal results are displayed) Labs Reviewed  COMPREHENSIVE METABOLIC PANEL - Abnormal; Notable for the following components:      Result Value   Creatinine, Ser 1.43 (*)    AST 64 (*)    ALT 79 (*)    GFR calc non Af Amer 59 (*)    All other components within normal limits  SALICYLATE LEVEL - Abnormal; Notable for the following components:   Salicylate Lvl <7.0 (*)    All other components within normal limits  CBC WITH DIFFERENTIAL/PLATELET - Abnormal; Notable for the following components:   WBC 14.7 (*)    Neutro Abs 11.0 (*)    Monocytes Absolute 1.2 (*)    All other components within normal limits  ACETAMINOPHEN LEVEL - Abnormal;  Notable for the following components:   Acetaminophen (Tylenol), Serum <10 (*)    All other components within normal limits  ETHANOL  LIPASE, BLOOD    EKG None  Radiology DG Abdomen 1 View  Result Date: 12/02/2019 CLINICAL DATA:  44 year old male with abdominal pain and nausea EXAM: ABDOMEN - 1 VIEW COMPARISON:  None. FINDINGS: There is no bowel dilatation or evidence of obstruction. No free air or radiopaque calculi. The osseous structures and soft tissues are unremarkable. IMPRESSION: Negative. Electronically Signed   By: Elgie Collard M.D.   On: 12/02/2019 18:52    Procedures Procedures (including critical care time)  Medications Ordered in ED Medications  sodium chloride 0.9 % bolus 1,000 mL (0 mLs Intravenous Stopped 12/02/19 2200)  LORazepam (ATIVAN) injection 1 mg (1 mg Intravenous Given 12/02/19 2039)    ED Course  I have reviewed the triage vital signs and the nursing notes.  Pertinent labs & imaging results that were available during my care of the patient were reviewed by me and considered in my medical decision making (see chart for details).    MDM Rules/Calculators/A&P                      Patient presents with nausea, mild epigastric discomfort and clinically signs of drug abuse.  Patient is paranoid, tachycardic, dry clinically.  Plan for IV fluids, general blood work, ingestion lab work, Ativan and reassessment.  X-ray ordered for possible ingestion however likely this is due to his paranoia.  X-ray results reviewed no acute abnormalities.  Blood work reviewed creatinine 1.43, IV fluids were given.  Acetaminophen and salicylate levels normal.  Liver function mild elevated similar to previous 60s and 70s.  Patient requesting to leave prior to reassessment.  HR improved in the ED>   Final Clinical Impression(s) / ED Diagnoses Final diagnoses:  Pain of upper abdomen  Methamphetamine use [facility])    Rx / DC Orders ED Discharge Orders    None         Blane Ohara, MD 12/04/19 1024

## 2019-12-21 DIAGNOSIS — F251 Schizoaffective disorder, depressive type: Secondary | ICD-10-CM | POA: Insufficient documentation

## 2019-12-21 DIAGNOSIS — F1520 Other stimulant dependence, uncomplicated: Secondary | ICD-10-CM | POA: Insufficient documentation

## 2019-12-21 DIAGNOSIS — F1020 Alcohol dependence, uncomplicated: Secondary | ICD-10-CM | POA: Insufficient documentation

## 2019-12-21 DIAGNOSIS — F15259 Other stimulant dependence with stimulant-induced psychotic disorder, unspecified: Secondary | ICD-10-CM | POA: Insufficient documentation

## 2020-01-25 ENCOUNTER — Telehealth: Payer: Self-pay | Admitting: Pharmacy Technician

## 2020-01-25 NOTE — Telephone Encounter (Signed)
[facility] Patient Advocate Encounter ° °Insurance verification completed.   ° °The patient is uninsured and will need patient assistance for medication. ° °We can complete the application and will need to meet with the patient for signatures and income documentation. ° °Idan Prime E. Shivam Mestas, CPhT °Specialty Pharmacy Patient Advocate °[facility] °Phone: [phone] °Fax:  [phone] ° ° °

## 2020-01-28 ENCOUNTER — Encounter: Payer: Self-pay | Admitting: Family

## 2020-02-12 ENCOUNTER — Encounter: Payer: Self-pay | Admitting: Infectious Diseases

## 2020-02-12 ENCOUNTER — Ambulatory Visit (INDEPENDENT_AMBULATORY_CARE_PROVIDER_SITE_OTHER): Payer: Self-pay | Admitting: Infectious Diseases

## 2020-02-12 ENCOUNTER — Telehealth: Payer: Self-pay | Admitting: Pharmacy Technician

## 2020-02-12 ENCOUNTER — Other Ambulatory Visit: Payer: Self-pay

## 2020-02-12 VITALS — BP 110/73 | HR 64 | Temp 98.0°F | Ht 71.0 in | Wt 171.0 lb

## 2020-02-12 DIAGNOSIS — F1020 Alcohol dependence, uncomplicated: Secondary | ICD-10-CM

## 2020-02-12 DIAGNOSIS — Z5989 Other problems related to housing and economic circumstances: Secondary | ICD-10-CM

## 2020-02-12 DIAGNOSIS — F15259 Other stimulant dependence with stimulant-induced psychotic disorder, unspecified: Secondary | ICD-10-CM

## 2020-02-12 DIAGNOSIS — Z598 Other problems related to housing and economic circumstances: Secondary | ICD-10-CM

## 2020-02-12 DIAGNOSIS — B182 Chronic viral hepatitis C: Secondary | ICD-10-CM | POA: Insufficient documentation

## 2020-02-12 NOTE — Progress Notes (Signed)
Patient Name: Reginald Lawson  Date of Birth: 28-Jan-1976  MRN: [id number]  PCP: Patient, No Pcp Per  Referring Provider: Starr Sinclair, MD, Ph#: [phone]   Patient Active Problem List   Diagnosis Date Noted  . Chronic hepatitis C without hepatic coma (HCC) 02/12/2020  . Uninsured 02/12/2020  . Uncomplicated alcohol dependence (HCC) 12/21/2019  . Other stimulant dependence, uncomplicated (HCC) 12/21/2019  . Schizoaffective disorder, depressive type (HCC) 12/21/2019  . 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     HPI/ROS:  Reginald Lawson is a 44 y.o. male. He has no significant past medical history aside from substance use on and off throughout the years. Most recently has had trouble with IV methamphetamines and cocaine use. He has had several attempts at drug rehab and stays clean for several months at a time. He does drink some alcohol but does not drink often - it is a few days a week but not daily. States he does not binge drink.   He was first told he had hepatitis C about 3-4 years ago. Records indicate a  (+) qualitive hepatitis C level in 02-2017.  He does recall a period of time where he was jaundiced and had dark yellow skin and dark urine and felt unwell. Unclear as to how long that lasted but he did get better without any treatment or hospital care. None of those symptoms have returned since.   He has a gallbladder problem - had an ultrasound done about 3 years ago and was recommended it to be removed. Feels like hepatitis C is the cause of his gallbladder issues also.  Had 3 shots for hepatitis B a few years ago ~ 2002.  Not currently sexually active with any partners.  He is uninsured and will need some help with bills.   Review of Symptoms:  Constitutional: negative for fevers, fatigue, malaise and anorexia Eyes: negative for  icterus Cardiovascular: negative for chest pain, fatigue, orthopnea, lower extremity edema Gastrointestinal: negative for nausea, vomiting, change in bowel habits, diarrhea and jaundice; positive abdominal pain  Integument/breast: negative for rash and skin lesion(s) Hematologic/lymphatic: negative for easy bruising and petechiae Musculoskeletal: negative for arthralgias Neurological: negative for headaches, paresthesia and gait problems Behavioral/Psych: negative for excessive alcohol consumption; positive substance use  All other systems reviewed and are negative      Past Medical History:  Diagnosis Date  . Chronic hepatitis C (HCC)     Prior to Admission medications   Not on File    No Known Allergies  Social History   Tobacco Use  . Smoking status: Current Every Day Smoker    Types: Cigarettes  . Smokeless tobacco: Current User    Types: Snuff  Substance Use Topics  . Alcohol use: Yes  . Drug use: Yes    Types: IV, Methamphetamines, Marijuana    Family History  Problem Relation Age of Onset  . Liver disease Neg Hx   . Liver cancer Neg Hx      Objective:   Vitals:   02/12/20 0911  BP: 110/73  Pulse: 64  Temp: 98 F (36.7 C)   Constitutional: in no apparent distress, well developed and well nourished and oriented times 3 Eyes: anicteric Cardiovascular: Cor RRR and No murmurs Respiratory: clear Gastrointestinal: Bowel sounds are normal, liver is not enlarged, spleen is not enlarged Musculoskeletal: peripheral pulses normal, no pedal edema,  no clubbing or cyanosis Skin: negative for - jaundice, spider hemangioma, telangiectasia, palmar erythema, ecchymosis and atrophy; no porphyria cutanea tarda Lymphatic: no cervical lymphadenopathy   Laboratory: Genotype: No results found for: HCVGENOTYPE HCV viral load: No results found for: HCVQUANT Lab Results  Component Value Date   WBC 14.7 (H) 12/02/2019   HGB 15.6 12/02/2019   HCT 45.2 12/02/2019   MCV  92.8 12/02/2019   PLT 348 12/02/2019    Lab Results  Component Value Date   CREATININE 1.43 (H) 12/02/2019   BUN 14 12/02/2019   NA 145 12/02/2019   K 4.1 12/02/2019   CL 110 12/02/2019   CO2 25 12/02/2019    Lab Results  Component Value Date   ALT 79 (H) 12/02/2019   AST 64 (H) 12/02/2019   ALKPHOS 73 12/02/2019    Lab Results  Component Value Date   BILITOT 1.2 12/02/2019   ALBUMIN 4.4 12/02/2019    APRI 0.476   FIB-4 0.95  Imaging:  None on file    Assessment & Plan:   Problem List Items Addressed This Visit      Unprioritized   Chronic hepatitis C without hepatic coma (HCC) - Primary (Chronic)    New Patient with Chronic Hepatitis C genotype 1a (from 2018 records), treatment naive. Fibrosis risk overall seems very low risk for any severe or permanent liver disease with APRI 0.476 & FIB-4 0.95.   I discussed with the patient the lab findings that confirm chronic hepatitis C as well as the natural history and progression of disease including about 30% of people who develop cirrhosis of the liver if left untreated and once cirrhosis is established there is a 2-7% risk per year of liver cancer and liver failure.  I discussed the importance of treatment and benefits in reducing the risk, even if significant liver fibrosis exists. I also discussed risk for re-infection following treatment should he not continue to modify risk factors.    Patient counseled extensively on limiting acetaminophen to no more than 2 grams daily, avoidance of alcohol.  Transmission discussed with patient including sexual transmission, sharing razors and toothbrush.   Will need referral to gastroenterology if concern for cirrhosis  I think his drug use is at a point where he is abstaining and now is a good time to treat him. He was very open about previous use and how this he feels will be a motivating factor to stay clean.   Will plan on [place] x 8 weeks.   Hepatitis A and B titers to be  drawn today with appropriate vaccinations as needed   Pneumovax vaccine at upcoming visit if not previously given  Further work up to include liver staging through non-invasive serum analysis with APRI and FIB4 scores and Liver Fibrosis panel; U/S to follow if discordant or concerning results.  Will call Aadon Gorelik back once all results are in and counsel on medication over the phone. He will return 4 weeks after starting to meet with pharmacy team and check RNA at that time.        Uncomplicated alcohol dependence (HCC)    Counseled today re: decreasing use as much as possible with no alcohol preferred; especially no binging behavior. He states he can do this no problem.       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 Was in care at [facility] per records.  Uninsured    He was provided with financial assistance applications for [employer], [employer] and medication assistance today.          I spent 45 minutes with the patient including greater than 70% of time in face to face counsel of the patient re hepatitis c and the details described above and in coordination of their care.  Rexene Alberts, MSN, NP-C [facility]  [email or web address] Pager: [phone] Office: [phone] [facility] Main Line: [phone]

## 2020-02-12 NOTE — Assessment & Plan Note (Signed)
Counseled today re: decreasing use as much as possible with no alcohol preferred; especially no binging behavior. He states he can do this no problem.

## 2020-02-12 NOTE — Telephone Encounter (Signed)
[facility] Patient Advocate Encounter  Gathered needed documents, signatures and will await medication selection before submitting.  Reginald Lawson. Reginald Lawson CPhT Specialty Pharmacy Patient [facility] Phone: [phone] Fax:  [phone]

## 2020-02-12 NOTE — Patient Instructions (Signed)
Nice to meet you today!    We need to get a little more information about your hepatitis c infection before we start your treatment. I anticipate that we can get you started in a few weeks after we submit approval to your insurance to ensure payment. We may need to place referral for an ultrasound and/or gastroenterology if your blood work indicates more damage to the liver than expected.      ABOUT HEPATITIS C VIRUS:   Chronic Hepatitis C is the most common blood-borne infection in the [place], affecting approximately 3 million people.   It is the leading cause of cirrhosis, liver cancer, and end stage liver disease requiring transplantation when this infection goes untreated for many years   The majority of people who are infected are unaware because there are not many early symptoms that are specific to this and often go undiagnosed until a specific blood test is drawn.    The hepatitis c virus is passed primarily through direct exposure of contaminated blood or body fluids. It is most efficiently transmitted through repeated exposure to infected blood.   Risk for sexual transmission is very low but is possible if there is high frequency of unprotected sexual activity with known hepatitis c partner or multiple partners of known status.   Over time, approximately 60-70% of people can develop some degree of liver disease. Cirrhosis occurs in 10-20% of those with chronic infection. 1-5% will get liver cancer, which has a very high rate of death.    Approximately 15-25% clear the infection without medication (usually in the first 6 months of becoming exposed to virus)   Newer medications provide over 95% cure rate when taken as prescribed    IN GENERAL ABOUT DIET  . Persons living with chronic hepatitis c infection should eat a diet to maintain a healthy weight and avoid nutritional deficiencies.   . Completely avoiding alcohol is the best decision for your liver health. If  unable to do so please limit alcohol to as little as possible to less than 1 standard drink a day - this is very irritating to your liver.  . Limit tylenol use to less than 2,000 mg daily (two extra strength tablets only twice a day)  . If you have cirrhosis of the liver please take no more than 1,000 mg tylenol a day  . Patients with cirrhosis should not have protein restriction; we recommend a protein intake of approximately 1.2-1.5 g/kg/day.   . For patients with cirrhosis and hepatic encephalopathy, the American Association for the Study of Liver Diseases (AASLD) recommended protein intake is 1.2-1.5 g/kg/day.  . If you experience ascites (fluid accumulation in the abdomen associated with severe liver damage / cirrhosis) please limit sodium intake to < 2000 mg a day    UNTIL YOU HAVE BEEN TREATED AND CURED:  . Use condoms with all sexual encounters or practice abstinence to avoid sexual transmission   . No sharing of razors, toothbrushes, nail clippers or anything that could potentially have blood on it.   . If you cut yourself please clean and cover any wounds or open sores to others do not come into contact with your blood.   . If blood spills onto item/surface please clean with 1:10 bleach solution and allow to dry, EVEN if it is dried blood.    GENERAL HELPFUL HINTS ON HCV THERAPY:  1. Stay well-hydrated.  2. Notify the [facility] of any changes in your other over-the-counter/herbal or prescription medications.    3. If you miss a dose of your medication, take the missed dose as soon as you remember. Return to your regular time/dose schedule the next day.   4.  Do not stop taking your medications without first talking with your healthcare provider.  5.  You will see our pharmacist-specialist within the first 2 weeks of starting your medication to monitor for any possible side effects.  6.  You will have blood work once during treatment 4 weeks after your first pill. Again  soon after treatment is completed and one final lab 3 months after your last pill to ensure cure!   TIPS TO BE SUCCESSFUL WITH DAILY MEDICATION USE:  1. Set a reminder on your phone  2. Try filling out a pill box for the week - pick a day and put one pill for every day during the week so you know right away if you missed a pill.   3. Have a trusted family member ask you about your medications.   4. Smartphone app    Medication we would like to use for you will be : MAVYRET  Mavyret Instructions:  1. Take Mavyret, three tablets (at the same time) daily with food. Please take ALL THREE PILLS AT ONCE. You should take it at approximately the same time every day. Treatment will be for 8 weeks. Do not miss a dose.    2. Do not run out of Mavyret! If you are down to one week of medication left and have not heard about your next shipment, please let us know as soon as possible. You will be given 28 days of treatment at a time and will receive one refill.   3. If you need to start a new medication, prescription from your doctor or over the counter medication, you need to contact us to make sure it does not interfere with Mavyret. There are several medications that can interfere with Mavyret and can make you sick or make the medication not work.  4. If you need to take a medication for acid reflux, you can take omeprazole 20mg daily.     5. Tylenol (acetominophen) and Advil (ibuprofen) are safe to take with Harvoni if needed for headache, fever, pain.   6. IF YOU ARE ON BIRTH CONTROL PILLS, YOU RECEIVE A SHOT FOR BIRTH CONTROL, OR YOU HAVE AN IUD, please notify your provider to make sure it is safe with Mavyret.   7. DO NOT stop Mavyret unless instructed to by your provider. If you are hospitalized while taking this medication please bring it with you to the hospital to avoid interruption of therapy. Every pill is important!  8. The most common side effects associated with Mavyret include:   o Fatigue o Headache o Nausea o Diarrhea o Insomnia     

## 2020-02-12 NOTE — Assessment & Plan Note (Signed)
He was provided with financial assistance applications for [employer], [employer] and medication assistance today.

## 2020-02-12 NOTE — Assessment & Plan Note (Signed)
New Patient with Chronic Hepatitis C genotype 1a (from 2018 records), treatment naive. Fibrosis risk overall seems very low risk for any severe or permanent liver disease with APRI 0.476 & FIB-4 0.95.   I discussed with the patient the lab findings that confirm chronic hepatitis C as well as the natural history and progression of disease including about 30% of people who develop cirrhosis of the liver if left untreated and once cirrhosis is established there is a 2-7% risk per year of liver cancer and liver failure.  I discussed the importance of treatment and benefits in reducing the risk, even if significant liver fibrosis exists. I also discussed risk for re-infection following treatment should he not continue to modify risk factors.    Patient counseled extensively on limiting acetaminophen to no more than 2 grams daily, avoidance of alcohol.  Transmission discussed with patient including sexual transmission, sharing razors and toothbrush.   Will need referral to gastroenterology if concern for cirrhosis  I think his drug use is at a point where he is abstaining and now is a good time to treat him. He was very open about previous use and how this he feels will be a motivating factor to stay clean.   Will plan on Mavyret x 8 weeks.   Hepatitis A and B titers to be drawn today with appropriate vaccinations as needed   Pneumovax vaccine at upcoming visit if not previously given  Further work up to include liver staging through non-invasive serum analysis with APRI and FIB4 scores and Liver Fibrosis panel; U/S to follow if discordant or concerning results.  Will call Burleigh Brockmann back once all results are in and counsel on medication over the phone. He will return 4 weeks after starting to meet with pharmacy team and check RNA at that time.

## 2020-02-12 NOTE — Telephone Encounter (Signed)
Insurance verification information remains the same for July 2021.  Reginald Lawson. Reginald Lawson CPhT Specialty Pharmacy Patient [facility] Phone: [phone] Fax:  [phone]

## 2020-02-12 NOTE — Assessment & Plan Note (Signed)
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 Was in care at [facility] per records.

## 2020-02-16 LAB — COMPREHENSIVE METABOLIC PANEL
AG Ratio: 1.7 (calc) (ref 1.0–2.5)
ALT: 49 U/L — ABNORMAL HIGH (ref 9–46)
AST: 34 U/L (ref 10–40)
Albumin: 4.6 g/dL (ref 3.6–5.1)
Alkaline phosphatase (APISO): 68 U/L (ref 36–130)
BUN: 15 mg/dL (ref 7–25)
CO2: 26 mmol/L (ref 20–32)
Calcium: 9.6 mg/dL (ref 8.6–10.3)
Chloride: 109 mmol/L (ref 98–110)
Creat: 1 mg/dL (ref 0.60–1.35)
Globulin: 2.7 g/dL (calc) (ref 1.9–3.7)
Glucose, Bld: 86 mg/dL (ref 65–99)
Potassium: 4.4 mmol/L (ref 3.5–5.3)
Sodium: 140 mmol/L (ref 135–146)
Total Bilirubin: 1 mg/dL (ref 0.2–1.2)
Total Protein: 7.3 g/dL (ref 6.1–8.1)

## 2020-02-16 LAB — LIVER FIBROSIS, FIBROTEST-ACTITEST
ALT: 50 U/L — ABNORMAL HIGH (ref 9–46)
Alpha-2-Macroglobulin: 162 mg/dL (ref 106–279)
Apolipoprotein A1: 143 mg/dL (ref 94–176)
Bilirubin: 0.9 mg/dL (ref 0.2–1.2)
Fibrosis Score: 0.26
GGT: 39 U/L (ref 3–95)
Haptoglobin: 104 mg/dL (ref 43–212)
Necroinflammat ACT Score: 0.28
Reference ID: 3466167

## 2020-02-16 LAB — HEPATITIS A ANTIBODY, TOTAL: Hepatitis A AB,Total: NONREACTIVE

## 2020-02-16 LAB — HIV ANTIBODY (ROUTINE TESTING W REFLEX): HIV 1&2 Ab, 4th Generation: NONREACTIVE

## 2020-02-16 LAB — PROTIME-INR
INR: 1
Prothrombin Time: 10.2 s (ref 9.0–11.5)

## 2020-02-16 LAB — HEPATITIS C RNA QUANTITATIVE
HCV Quantitative Log: 6.09 Log IU/mL — ABNORMAL HIGH
HCV RNA, PCR, QN: 1220000 IU/mL — ABNORMAL HIGH

## 2020-02-16 LAB — CBC
HCT: 47.4 % (ref 38.5–50.0)
Hemoglobin: 16.7 g/dL (ref 13.2–17.1)
MCH: 32.9 pg (ref 27.0–33.0)
MCHC: 35.2 g/dL (ref 32.0–36.0)
MCV: 93.3 fL (ref 80.0–100.0)
MPV: 9.7 fL (ref 7.5–12.5)
Platelets: 391 10*3/uL (ref 140–400)
RBC: 5.08 10*6/uL (ref 4.20–5.80)
RDW: 12.9 % (ref 11.0–15.0)
WBC: 6.4 10*3/uL (ref 3.8–10.8)

## 2020-02-16 LAB — HEPATITIS B CORE ANTIBODY, TOTAL: Hep B Core Total Ab: NONREACTIVE

## 2020-02-16 LAB — HEPATITIS C GENOTYPE

## 2020-02-16 LAB — HEPATITIS B SURFACE ANTIGEN: Hepatitis B Surface Ag: NONREACTIVE

## 2020-02-16 LAB — HEPATITIS B SURFACE ANTIBODY,QUALITATIVE: Hep B S Ab: REACTIVE — AB

## 2020-02-18 ENCOUNTER — Telehealth: Payer: Self-pay | Admitting: Pharmacy Technician

## 2020-02-18 ENCOUNTER — Other Ambulatory Visit: Payer: Self-pay | Admitting: Pharmacist

## 2020-02-18 DIAGNOSIS — B182 Chronic viral hepatitis C: Secondary | ICD-10-CM

## 2020-02-18 MED ORDER — LEDIPASVIR-SOFOSBUVIR 90-400 MG PO TABS: 1.0000 | ORAL_TABLET | Freq: Every day | ORAL | 1 refills | Status: DC

## 2020-02-18 NOTE — Progress Notes (Signed)
Harvoni Rx for patient's chronic Hepatitis C infection

## 2020-02-18 NOTE — Telephone Encounter (Addendum)
[facility] Patient Advocate Encounter  Completed and sent [employer] application for Harvoni for this patient who is uninsured.    Patient is APPROVED.  [employer] is reaching out to patient to set up delivery.  We will continue to follow.  Netty Starring. Dimas Aguas CPhT Specialty Pharmacy Patient [facility] for Infect[facility] Disease Phone: [phone] Fax:  [phone]

## 2020-02-19 ENCOUNTER — Telehealth: Payer: Self-pay | Admitting: *Deleted

## 2020-02-19 NOTE — Telephone Encounter (Signed)
RN left message asking patient to call his doctor's office back for results and next steps. Andree Coss, RN

## 2020-02-19 NOTE — Telephone Encounter (Signed)
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 He has some very mild inflammation (fibrosis) and it will likely all be reversed with treatment. Would encourage him to continue to abstain from all alcohol to minimize ongoing inflammation  Pharmacy team -  Would prefer to use Harvoni x 8 weeks for single tablet, short duration of therapy Thank you all for your help!

## 2020-02-20 NOTE — Telephone Encounter (Signed)
[facility] Patient Advocate Encounter  Completed and sent [employer] application for Harvoni for this patient who is uninsured.    Patient is APPROVED.  I left a HIPPA complaint voicemail with the patient asking that he call back.  This encounter will be updated until final determination.   Netty Starring. Dimas Aguas CPhT Specialty Pharmacy Patient [facility] for Infectious Disease Phone: [phone] Fax:  [phone]

## 2020-02-21 NOTE — Telephone Encounter (Signed)
Patient returned call and updated. Reginald Lawson  

## 2020-03-04 ENCOUNTER — Encounter: Payer: Self-pay | Admitting: Pharmacy Technician

## 2020-04-02 ENCOUNTER — Ambulatory Visit: Payer: Self-pay | Admitting: Pharmacist

## 2020-04-09 ENCOUNTER — Encounter (HOSPITAL_COMMUNITY): Payer: Self-pay | Admitting: Emergency Medicine

## 2020-04-09 ENCOUNTER — Emergency Department (HOSPITAL_COMMUNITY)
Admission: EM | Admit: 2020-04-09 | Discharge: 2020-04-10 | Disposition: A | Discharge status: Home / Self Care | Payer: Self-pay | Attending: Emergency Medicine | Admitting: Emergency Medicine

## 2020-04-09 ENCOUNTER — Other Ambulatory Visit: Payer: Self-pay

## 2020-04-09 DIAGNOSIS — F17210 Nicotine dependence, cigarettes, uncomplicated: Secondary | ICD-10-CM | POA: Insufficient documentation

## 2020-04-09 DIAGNOSIS — F3281 Premenstrual dysphoric disorder: Secondary | ICD-10-CM | POA: Insufficient documentation

## 2020-04-09 DIAGNOSIS — R443 Hallucinations, unspecified: Secondary | ICD-10-CM | POA: Insufficient documentation

## 2020-04-09 DIAGNOSIS — Z20822 Contact with and (suspected) exposure to covid-19: Secondary | ICD-10-CM | POA: Insufficient documentation

## 2020-04-09 DIAGNOSIS — F1010 Alcohol abuse, uncomplicated: Secondary | ICD-10-CM | POA: Insufficient documentation

## 2020-04-09 DIAGNOSIS — R531 Weakness: Secondary | ICD-10-CM | POA: Insufficient documentation

## 2020-04-09 LAB — COMPREHENSIVE METABOLIC PANEL
ALT: 15 U/L (ref 0–44)
AST: 19 U/L (ref 15–41)
Albumin: 3.8 g/dL (ref 3.5–5.0)
Alkaline Phosphatase: 74 U/L (ref 38–126)
Anion gap: 14 (ref 5–15)
BUN: <5 mg/dL — ABNORMAL LOW (ref 6–20)
CO2: 21 mmol/L — ABNORMAL LOW (ref 22–32)
Calcium: 9.1 mg/dL (ref 8.9–10.3)
Chloride: 105 mmol/L (ref 98–111)
Creatinine, Ser: 1.02 mg/dL (ref 0.61–1.24)
GFR calc Af Amer: >60 mL/min (ref >60)
GFR calc non Af Amer: >60 mL/min (ref >60)
Glucose, Bld: 83 mg/dL (ref 70–99)
Potassium: 4.1 mmol/L (ref 3.5–5.1)
Sodium: 140 mmol/L (ref 135–145)
Total Bilirubin: 0.6 mg/dL (ref 0.3–1.2)
Total Protein: 7.4 g/dL (ref 6.5–8.1)

## 2020-04-09 LAB — SALICYLATE LEVEL: Salicylate Lvl: <7 mg/dL — ABNORMAL LOW (ref 7.0–30.0)

## 2020-04-09 LAB — ETHANOL: Alcohol, Ethyl (B): 228 mg/dL — ABNORMAL HIGH (ref <10)

## 2020-04-09 LAB — CBC
HCT: 47.2 % (ref 39.0–52.0)
Hemoglobin: 16.3 g/dL (ref 13.0–17.0)
MCH: 32.1 pg (ref 26.0–34.0)
MCHC: 34.5 g/dL (ref 30.0–36.0)
MCV: 92.9 fL (ref 80.0–100.0)
Platelets: 540 10*3/uL — ABNORMAL HIGH (ref 150–400)
RBC: 5.08 MIL/uL (ref 4.22–5.81)
RDW: 12.8 % (ref 11.5–15.5)
WBC: 6.7 10*3/uL (ref 4.0–10.5)
nRBC: 0 % (ref 0.0–0.2)

## 2020-04-09 LAB — SARS CORONAVIRUS 2 BY RT PCR (HOSPITAL ORDER, PERFORMED IN ~~LOC~~ HOSPITAL LAB): SARS Coronavirus 2: NEGATIVE

## 2020-04-09 LAB — ACETAMINOPHEN LEVEL: Acetaminophen (Tylenol), Serum: <10 ug/mL — ABNORMAL LOW (ref 10–30)

## 2020-04-09 MED ORDER — LORAZEPAM 1 MG PO TABS
0.0000 mg | ORAL_TABLET | Freq: Four times a day (QID) | ORAL | Status: DC
  Administered 2020-04-10: 2 mg via ORAL
  Filled 2020-04-09: qty 2

## 2020-04-09 MED ORDER — LORAZEPAM 1 MG PO TABS: 0.0000 mg | ORAL_TABLET | Freq: Two times a day (BID) | ORAL | Status: DC

## 2020-04-09 MED ORDER — THIAMINE HCL 100 MG PO TABS: 100.0000 mg | ORAL_TABLET | Freq: Every day | ORAL | Status: DC

## 2020-04-09 MED ORDER — LORAZEPAM 2 MG/ML IJ SOLN: 0.0000 mg | Freq: Two times a day (BID) | INTRAMUSCULAR | Status: DC

## 2020-04-09 MED ORDER — LORAZEPAM 2 MG/ML IJ SOLN: 0.0000 mg | Freq: Four times a day (QID) | INTRAMUSCULAR | Status: DC

## 2020-04-09 MED ORDER — THIAMINE HCL 100 MG/ML IJ SOLN: 100.0000 mg | Freq: Every day | INTRAMUSCULAR | Status: DC

## 2020-04-09 NOTE — ED Triage Notes (Addendum)
Patient arrives to ED via EMS with complaints of hearing voices and testing positive for COVID last week. Per pt he has been hearing voices for a couple of days that are telling him that he needs to take a test. The voices are not telling him to hurt himself. Also, pt states he has been felling weak lately and got tested positive for COVID a week ago. Pt states he has no symptoms at this time. Last beer was today and last used of meth was two days ago. Pt has hx of schizophrenia but takes no medicine. States mother is in hospital for COVID and has been messing with his mind.

## 2020-04-09 NOTE — BH Assessment (Signed)
This counselor informed and was acknowledged by Marcha Dutton, RN and Dietrich Pates, PA-C of pt's disposition.   Dolores Frame, MSW, LCSW-A Triage Specialist [phone]

## 2020-04-09 NOTE — BH Assessment (Addendum)
Per Nira Conn, NP pt meets inpatient criteria. Per Hassie Bruce, RN [facility] does not have any appropriate beds available. No scheduled discharges for 9/2.  Patient referred to the following facilities for consideration of bed placement:  [facility]  [facility]  [facility]  [facility]  [facility]  [facility]  [facility]  [facility]

## 2020-04-09 NOTE — BH Assessment (Addendum)
Comprehensive Clinical Assessment (CCA) Note  04/09/2020 Reginald Lawson [id number]  Visit Diagnosis: F20.9 Schizophrenia   ICD-10-CM   1. Hallucinations  R44.3   2. Alcohol abuse  F10.10     Disposition: Per Nira Conn, NP pt meets inpatient criteria. Per Hassie Bruce, RN [facility] does not have any appropriate beds available. CSW to follow up for placement.    Reginald Lawson is a 44 y.o male who voluntarily presents to [facility] ED via EMS. Pt reported, he has been worrying, because both of his parents are in the hospital with Covid. In addition, to going through an examination with the [place] [employer] via satellite for the past 10 months. Pt reported, he has been hearing harassing voices from the [facility]. Pt reported, active SI, HI, AVH with no plan. Pt reported, he has access to both guns and knives.   Pt reported, hx of cocaine, alcohol and meth use. Pt reported, [facility]. Pt reported, receiving services through [facility] at the [place] location. Pt admits to being prescribed medication, but no longer taking them.   Pt was alert and orient x5. Pt had normal eye contact with an anxious facial expression. Pt had a normal speech with a delusions and suspicious thought content. Pt was preoccupied with SI, HI and AVH. Pt had anxious affect and mood.    CCA Screening, Triage and Referral (STR)  Patient Reported Information How did you hear about [place]? No data recorded Referral name: No data recorded Referral phone number: No data recorded  Whom do you see for routine medical problems? No data recorded Practice/Facility Name: No data recorded Practice/Facility Phone Number: No data recorded Name of Contact: No data recorded Contact Number: No data recorded Contact Fax Number: No data recorded Prescriber Name: No data recorded Prescriber Address (if known): No data recorded  What Is the Reason for Your Visit/Call Today? No data recorded How Long  Has This Been Causing You Problems? No data recorded What Do You Feel Would Help You the Most Today? No data recorded  Have You Recently Been in Any Inpatient Treatment (Hospital/Detox/Crisis Center/28-Day Program)? No data recorded Name/Location of Program/Hospital:No data recorded How Long Were You There? No data recorded When Were You Discharged? No data recorded  Have You Ever Received Services From [facility] Before? No data recorded Who Do You See at [facility]? No data recorded  Have You Recently Had Any Thoughts About Hurting Yourself? No data recorded Are You Planning to Commit Suicide/Harm Yourself At This time? No data recorded  Have you Recently Had Thoughts About Hurting Someone Karolee Ohs? No data recorded Explanation: No data recorded  Have You Used Any Alcohol or Drugs in the Past 24 Hours? No data recorded How Long Ago Did You Use Drugs or Alcohol? No data recorded What Did You Use and How Much? No data recorded  Do You Currently Have a Therapist/Psychiatrist? No data recorded Name of Therapist/Psychiatrist: No data recorded  Have You Been Recently Discharged From Any Office Practice or Programs? No data recorded Explanation of Discharge From Practice/Program: No data recorded    CCA Screening Triage Referral Assessment Type of Contact: No data recorded Is this Initial or Reassessment? No data recorded Date Telepsych consult ordered in [facility]:  No data recorded Time Telepsych consult ordered in [facility]:  No data recorded  Patient Reported Information Reviewed? No data recorded Patient Left Without Being Seen? No data recorded Reason for Not Completing Assessment: No  data recorded  Collateral Involvement: No data recorded  Does Patient Have a Court Appointed Legal Guardian? No data recorded Name and Contact of Legal Guardian: No data recorded If Minor and Not Living with Parent(s), Who has Custody? No data recorded Is CPS involved or ever been involved? No data  recorded Is APS involved or ever been involved? No data recorded  Patient Determined To Be At Risk for Harm To Self or Others Based on Review of Patient Reported Information or Presenting Complaint? No data recorded Method: No data recorded Availability of Means: No data recorded Intent: No data recorded Notification Required: No data recorded Additional Information for Danger to Others Potential: No data recorded Additional Comments for Danger to Others Potential: No data recorded Are There Guns or Other Weapons in Your Home? No data recorded Types of Guns/Weapons: No data recorded Are These Weapons Safely Secured?                            No data recorded Who Could Verify You Are Able To Have These Secured: No data recorded Do You Have any Outstanding Charges, Pending Court Dates, Parole/Probation? No data recorded Contacted To Inform of Risk of Harm To Self or Others: No data recorded  Location of Assessment: No data recorded  Does Patient Present under Involuntary Commitment? No data recorded IVC Papers Initial File Date: No data recorded  [place] of Residence: No data recorded  Patient Currently Receiving the Following Services: No data recorded  Determination of Need: No data recorded  Options For Referral: No data recorded    CCA Biopsychosocial  Intake/Chief Complaint:  CCA Intake With Chief Complaint CCA Part Two Date: 04/09/20 CCA Part Two Time: 2041 Chief Complaint/Presenting Problem: Pt reported, both of his parents are in the hospital with Covid. Also, he is going through an examination with the [place] Board the past 10 months via satellite. Patient's Currently Reported Symptoms/Problems: confusion Individual's Strengths: N/A Individual's Preferences: N/A Individual's Abilities: N/A Type of Services Patient Feels Are Needed: UTA Initial Clinical Notes/Concerns: Pt needs to get back on medication  Mental Health Symptoms Depression:  Depression: Change in  energy/activity, Difficulty Concentrating, Fatigue, Hopelessness, Increase/decrease in appetite, Irritability, Sleep (too much or little), Tearfulness, Weight gain/loss, Worthlessness, Duration of symptoms greater than two weeks  Mania:  Mania: Change in energy/activity, Irritability, Racing thoughts  Anxiety:   Anxiety: Difficulty concentrating, Fatigue, Irritability, Worrying  Psychosis:  Psychosis: Hallucinations, Delusions, Duration of symptoms greater than six months, Grossly disorganized speech, Grossly disorganized or catatonic behavior  Trauma:  Trauma: N/A  Obsessions:  Obsessions: Recurrent & persistent thoughts/impulses/images, Poor insight  Compulsions:  Compulsions: N/A  Inattention:  Inattention: N/A  Hyperactivity/Impulsivity:  Hyperactivity/Impulsivity: N/A  Oppositional/Defiant Behaviors:  Oppositional/Defiant Behaviors: N/A  Emotional Irregularity:  Emotional Irregularity: Intense/inappropriate anger, Potentially harmful impulsivity, Recurrent suicidal behaviors/gestures/threats, Transient, stress-related paranoia/disassociation  Other Mood/Personality Symptoms:  Other Mood/Personality Symptoms: N/A   Mental Status Exam Appearance and self-care  Stature:  Stature: Average  Weight:  Weight: Average weight  Clothing:  Clothing:  (Pt in scrubs)  Grooming:  Grooming: Normal  Cosmetic use:  Cosmetic Use: None  Posture/gait:  Posture/Gait:  (Pt was laying down.)  Motor activity:  Motor Activity: Not Remarkable  Sensorium  Attention:  Attention: Normal  Concentration:  Concentration: Preoccupied  Orientation:  Orientation: X5  Recall/memory:  Recall/Memory: Normal  Affect and Mood  Affect:  Affect: Anxious  Mood:  Mood: Anxious  Relating  Eye  contact:  Eye Contact: Normal  Facial expression:  Facial Expression: Anxious  Attitude toward examiner:  Attitude Toward Examiner: Cooperative  Thought and Language  Speech flow: Speech Flow: Normal  Thought content:  Thought  Content: Delusions, Suspicious  Preoccupation:  Preoccupations: Suicide, Homicidal  Hallucinations:  Hallucinations: Auditory  Organization:     Company secretaryxecutive Functions  Fund of Knowledge:  Fund of Knowledge:  Industrial/product designer(UTA)  Intelligence:  Intelligence:  Industrial/product designer(UTA)  Abstraction:  Abstraction:  Industrial/product designer(UTA)  Judgement:  Judgement: Poor, Impaired  Reality Testing:  Reality Testing:  (UTA)  Insight:  Insight: Lacking, Poor, Shallow  Decision Making:  Decision Making: Paralyzed  Social Functioning  Social Maturity:  Social Maturity:  Industrial/product designer(UTA)  Social Judgement:  Social Judgement:  (UTA)  Stress  Stressors:  Stressors:  (Pt reported, parents sick with covid and going through the examination with the [place] Board.)  Coping Ability:  Coping Ability: Building surveyorverwhelmed  Skill Deficits:  Skill Deficits: Self-control, Decision making  Supports:  Supports:  (N/A)     Religion: Religion/Spirituality Are You A Religious Person?: No How Might This Affect Treatment?: N/A  Leisure/Recreation: Leisure / Recreation Do You Have Hobbies?: No  Exercise/Diet: Exercise/Diet Do You Exercise?: No Have You Gained or Lost A Significant Amount of Weight in the Past Six Months?: Yes-Lost Number of Pounds Lost?: 15 Do You Follow a Special Diet?: No Do You Have Any Trouble Sleeping?: No   CCA Employment/Education  Employment/Work Situation: Employment / Work Situation Employment situation: Employed (Pt reported, on and off.) 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 Has patient ever been in the [employer] and Compan[employer]ilitary?: No  Education: Education Is Patient Currently Attending School?:  No Last Grade Completed: 12 Name of High School:  (UTA) Did You Graduate From [employer]?: Yes Did You Attend College?: No Did You Attend Graduate School?: No Did You Have Any Special Interests In School?:  (UTA) Did You Have An Individualized Education Program (IIEP):  (UTA) Did You Have Any Difficulty At School?:  (UTA) Patient's Education Has Been Impacted by Current Illness:  (UTA)   CCA Family/Childhood History  Family and Relationship History: Family history Marital status: Single Are you sexually active?: Yes What is your sexual orientation?:  (UTA) Has your sexual activity been affected by drugs, alcohol, medication, or emotional stress?:  (UTA) Does patient have children?: No  Childhood History:  Childhood History By whom was/is the patient raised?:  (UTA) Additional childhood history information:  (UTA) Description of patient's relationship with caregiver when they were a child:  (UTA) Patient's description of current relationship with people who raised him/her:  (UTA) How were you disciplined when you got in trouble as a child/adolescent?:  (UTA) Does patient have siblings?: Yes Number of Siblings: 1 Description of patient's current relationship with siblings: Pt reported, they have good and bad days. Did patient suffer any verbal/emotional/physical/sexual abuse as a child?: No Did patient suffer from severe childhood neglect?: No Has patient ever been sexually abused/assaulted/raped as an adolescent or adult?: No Was the patient ever a victim of a crime or a disaster?:  (UTA) Witnessed domestic violence?: No Has patient been affected  by domestic violence as an adult?: No  Child/Adolescent Assessment:     CCA Substance Use  Alcohol/Drug Use: Alcohol / Drug Use Pain Medications: Pt denies. Prescriptions: Pt denies. Over the Counter: Pt denies. History of alcohol / drug use?: Yes Longest period of sobriety (when/how long):  (UTA) Negative Consequences of  Use:  (UTA) Withdrawal Symptoms:  (UTA) Substance #1 Name of Substance 1: Cocaine 1 - Age of First Use: 44 y.o 1 - Amount (size/oz): UTA 1 - Frequency: UTA 1 - Duration: UTA 1 - Last Use / Amount: couple months ago Substance #2 Name of Substance 2: Alcohol 2 - Age of First Use: 44 y.o 2 - Amount (size/oz): Varies 2 - Frequency: Varies 2 - Duration: UTA 2 - Last Use / Amount: 04/09/20----6-7 hrs ago Substance #3 Name of Substance 3: Meth 3 - Age of First Use: 40's 3 - Amount (size/oz): UTA 3 - Frequency: UTA 3 - Duration: UTA 3 - Last Use / Amount: 6-7 days ago     ASAM's:  Six Dimensions of Multidimensional Assessment  Dimension 1:  Acute Intoxication and/or Withdrawal Potential:   Dimension 1:  Description of individual's past and current experiences of substance use and withdrawal:  (UTA)  Dimension 2:  Biomedical Conditions and Complications:   Dimension 2:  Description of patient's biomedical conditions and  complications:  (UTA)  Dimension 3:  Emotional, Behavioral, or Cognitive Conditions and Complications:  Dimension 3:  Description of emotional, behavioral, or cognitive conditions and complications:  (UTA)  Dimension 4:  Readiness to Change:  Dimension 4:  Description of Readiness to Change criteria:  (UTA)  Dimension 5:  Relapse, Continued use, or Continued Problem Potential:  Dimension 5:  Relapse, continued use, or continued problem potential critiera description:  (UTA)  Dimension 6:  Recovery/Living Environment:  Dimension 6:  Recovery/Iiving environment criteria description:  (UTA)  ASAM Severity Score:    ASAM Recommended Level of Treatment: ASAM Recommended Level of Treatment:  (UTA)   Substance use Disorder (SUD) Substance Use Disorder (SUD)  Checklist Symptoms of Substance Use:  (UTA)  Recommendations for Services/Supports/Treatments: Recommendations for Services/Supports/Treatments Recommendations For Services/Supports/Treatments: Individual Therapy,  Medication Management  DSM5 Diagnoses: Patient Active Problem List   Diagnosis Date Noted  . Chronic hepatitis C without hepatic coma (HCC) 02/12/2020  . Uninsured 02/12/2020  . Uncomplicated alcohol dependence (HCC) 12/21/2019  . Other stimulant dependence, uncomplicated (HCC) 12/21/2019  . Schizoaffective disorder, depressive type (HCC) 12/21/2019  . 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

## 2020-04-09 NOTE — ED Provider Notes (Signed)
[facility] EMERGENCY DEPARTMENT Provider Note   CSN: [id number] Arrival date & time: 04/09/20  1723     History Chief Complaint  Patient presents with  . Hallucinations  . Weakness    Reginald Lawson is a 44 y.o. male with a past medical history of chronic hep C, alcohol abuse, substance abuse presenting to the ED with a chief complaint of hallucinations.  States for the past 10 months he has been having auditory hallucinations with intermittent visual hallucinations.  States that he is being "tested by the [employer], they're talking to me through the satellites, I have to take a test that's like the bar exam, or the exam that nurses have to take. I'm being treated by the [employer] Board."  He believes this is what is causing him to hallucinate.  He does report increase stressors in his life but does not clarify what they are.  Earlier today was having suicidal ideation without plan.  Positive Covid test about 1-1/2 weeks ago but denies any chest pain, shortness of breath, fever.  Does report ongoing cough.  Reports "lately" he has been drinking alcohol every other day but does not drink daily at baseline.  Denies any drug use.  He was diagnosed with schizophrenia in the past but has never been on any medication.  Denies any current SI, HI, attempted overdose or suicide attempt in the past.  HPI     Past Medical History:  Diagnosis Date  . Chronic hepatitis C [facility])     Patient Active Problem List   Diagnosis Date Noted  . Chronic hepatitis C without hepatic coma (HCC) 02/12/2020  . Uninsured 02/12/2020  . Uncomplicated alcohol dependence (HCC) 12/21/2019  . Other stimulant dependence, uncomplicated (HCC) 12/21/2019  . Schizoaffective disorder, depressive type (HCC) 12/21/2019  . 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 NO PAST SURGERIES           Family History  Problem Relation Age of Onset  . Liver disease Neg Hx   . Liver cancer Neg Hx     Social History   Tobacco Use  . Smoking status: Current Every Day Smoker    Types: Cigarettes  . Smokeless tobacco: Current User    Types: Snuff  Substance Use Topics  . Alcohol use: Yes  . Drug use: Yes    Types: IV, Methamphetamines, Marijuana    Home Medications Prior to Admission medications   Medication Sig Start Date End Date Taking? Authorizing Provider  Ledipasvir-Sofosbuvir (HARVONI) 90-400 MG TABS Take 1 tablet by mouth daily. 02/18/20   Cliffton Asters, MD    Allergies    Patient has no known allergies.  Review of Systems   Review of Systems  Constitutional: Negative for appetite change, chills and fever.  HENT: Negative for ear pain, rhinorrhea, sneezing and sore throat.   Eyes: Negative for photophobia and visual disturbance.  Respiratory: Negative for cough, chest tightness, shortness of breath and wheezing.   Cardiovascular: Negative for chest pain and palpitations.  Gastrointestinal: Negative for abdominal pain, blood in stool, constipation, diarrhea, nausea and vomiting.  Genitourinary: Negative for dysuria, hematuria and urgency.  Musculoskeletal: Negative for myalgias.  Skin: Negative for rash.  Neurological: Negative for dizziness, weakness and light-headedness.  Psychiatric/Behavioral: Positive for dysphoric mood and hallucinations. Negative for agitation, sleep disturbance and suicidal ideas.    Physical Exam Updated Vital Signs BP  107/84 (BP Location: Right Arm)   Pulse (!) 102   Temp 98.6 F (37 C) (Oral)   Resp 18   SpO2 99%   Physical Exam Vitals and nursing note reviewed.  Constitutional:      General: He is not in acute distress.    Appearance: He is well-developed.  HENT:     Head: Normocephalic and atraumatic.     Nose: Nose normal.  Eyes:     General: No scleral icterus.       Right eye: No discharge.        Left eye:  No discharge.     Conjunctiva/sclera: Conjunctivae normal.  Cardiovascular:     Rate and Rhythm: Normal rate and regular rhythm.     Heart sounds: Normal heart sounds. No murmur heard.  No friction rub. No gallop.   Pulmonary:     Effort: Pulmonary effort is normal. No respiratory distress.     Breath sounds: Normal breath sounds.  Abdominal:     General: Bowel sounds are normal. There is no distension.     Palpations: Abdomen is soft.     Tenderness: There is no abdominal tenderness. There is no guarding.  Musculoskeletal:        General: Normal range of motion.     Cervical back: Normal range of motion and neck supple.  Skin:    General: Skin is warm and dry.     Findings: No rash.  Neurological:     Mental Status: He is alert.     Motor: No abnormal muscle tone.     Coordination: Coordination normal.     ED Results / Procedures / Treatments   Labs (all labs ordered are listed, but only abnormal results are displayed) Labs Reviewed  COMPREHENSIVE METABOLIC PANEL - Abnormal; Notable for the following components:      Result Value   CO2 21 (*)    BUN <5 (*)    All other components within normal limits  ETHANOL - Abnormal; Notable for the following components:   Alcohol, Ethyl (B) 228 (*)    All other components within normal limits  CBC - Abnormal; Notable for the following components:   Platelets 540 (*)    All other components within normal limits  SALICYLATE LEVEL - Abnormal; Notable for the following components:   Salicylate Lvl <7.0 (*)    All other components within normal limits  ACETAMINOPHEN LEVEL - Abnormal; Notable for the following components:   Acetaminophen (Tylenol), Serum <10 (*)    All other components within normal limits  SARS CORONAVIRUS 2 BY RT PCR (HOSPITAL ORDER, PERFORMED IN [facility])  RAPID URINE DRUG SCREEN, HOSP PERFORMED    EKG None  Radiology No results found.  Procedures Procedures (including critical care  time)  Medications Ordered in ED Medications  LORazepam (ATIVAN) injection 0-4 mg (has no administration in time range)    Or  LORazepam (ATIVAN) tablet 0-4 mg (has no administration in time range)  LORazepam (ATIVAN) injection 0-4 mg (has no administration in time range)    Or  LORazepam (ATIVAN) tablet 0-4 mg (has no administration in time range)  thiamine tablet 100 mg (has no administration in time range)    Or  thiamine (B-1) injection 100 mg (has no administration in time range)    ED Course  I have reviewed the triage vital signs and the nursing notes.  Pertinent labs & imaging results that were available during my care of the  patient were reviewed by me and considered in my medical decision making (see chart for details).    MDM Rules/Calculators/A&P                          44 year old male with past medical history of chronic hep C, alcohol abuse and sepsis presenting to the ED with a chief complaint of hallucinations.  Reports auditory and intermittent visual hallucinations for the past 10 months.  Details listed in HPI.  Tested positive for Covid 1/2 weeks ago but asymptomatic at this time.  Reports suicidal ideation earlier today.  Denies any chest pain, shortness of breath, fever, HI or current SI.  No attempted overdose or suicide attempts in the past.  Was diagnosed with schizophrenia but has never been on any medication.  On exam patient telling me about people talking to him through satellites.  No plan for suicide at this time.  Lab work significant for alcohol level elevated at 228, normal Tylenol and salicylate level.  CMP, CBC unremarkable.  Covid test is negative today.  UDS is pending, anticipate this may be positive for amphetamines as triage note states that he does have a history of meth use per patient denied this to me.  Patient does not appear to be going through alcohol withdrawal at this time.  CIWA protocol initiated.  He is medically cleared for TTS  evaluation and disposition.  10:05 PM Behavioral health specialist recommends inpatient treatment.  Will seek placement.    Portions of this note were generated with Scientist, clinical (histocompatibility and immunogenetics). Dictation errors may occur despite best attempts at proofreading.  Final Clinical Impression(s) / ED Diagnoses Final diagnoses:  Hallucinations  Alcohol abuse    Rx / DC Orders ED Discharge Orders    None       Dietrich Pates, PA-C 04/09/20 2206    Terald Sleeper, MD 04/10/20 [phone]

## 2020-04-09 NOTE — ED Notes (Signed)
This pts belongings were inventoried. All belongings were placed in Locker [address]3 of [facility]. Valuables were secured with security in Envelope #[phone].  No medications were found. Pt signed all necessary documentation.

## 2020-04-10 ENCOUNTER — Other Ambulatory Visit: Payer: Self-pay

## 2020-04-10 NOTE — ED Provider Notes (Signed)
Physical Exam  BP 132/86    Pulse 90    Temp 98.6 F (37 C) (Oral)    Resp 18    SpO2 99%   Physical Exam  ED Course/Procedures     Procedures  MDM  Patient here with hallucination and patient was intoxicated at that time.  Patient was started on CIWA and he states that his hallucination has been going on for about 10 months.  Patient initially met inpatient psych criteria and was looking at placement.  Patient states that he no longer wants to stay.  I talked to patient.  He is not under IVC.  He states that he has no plans to kill himself or others.  I offered to give him Librium taper for alcohol use but he refused.  We will give him list of counseling resources.  Patient states that he has a counselor that he can see.       Drenda Freeze, MD 04/10/20 [phone]

## 2020-04-10 NOTE — Discharge Instructions (Addendum)
Stop drinking alcohol.  Go to [facility] for follow-up.  You can also see the list of counseling resources.  Return to ER if you have thoughts of harming yourself or others, worsening hallucinations.

## 2020-04-10 NOTE — ED Notes (Signed)
CIWA reassessed as patient is having acute changes.

## 2020-04-10 NOTE — ED Notes (Signed)
Pt requesting to leave, Dr. Silverio Lay notified and at bedside.

## 2020-04-10 NOTE — Progress Notes (Signed)
Patient ID: Reginald Lawson, male   DOB: November 02, 1975, 44 y.o.   MRN: [id number]   I have made several attempts to reassess this patient however, attempts have been unsuccessful. Most recent attempt made at 3:34 pm and I was advised that patient was in the hallway and nurse would try to find a room for patient to be placed so the assessment could be completed. Called [facility] cart 1 however, received notification that it could not be accepted.

## 2020-05-12 ENCOUNTER — Emergency Department (HOSPITAL_COMMUNITY)
Admission: EM | Admit: 2020-05-12 | Discharge: 2020-05-12 | Disposition: A | Discharge status: Home / Self Care | Payer: Self-pay | Attending: Emergency Medicine | Admitting: Emergency Medicine

## 2020-05-12 ENCOUNTER — Other Ambulatory Visit: Payer: Self-pay

## 2020-05-12 DIAGNOSIS — R45851 Suicidal ideations: Secondary | ICD-10-CM | POA: Insufficient documentation

## 2020-05-12 DIAGNOSIS — Z5321 Procedure and treatment not carried out due to patient leaving prior to being seen by health care provider: Secondary | ICD-10-CM | POA: Insufficient documentation

## 2020-05-12 DIAGNOSIS — F1020 Alcohol dependence, uncomplicated: Secondary | ICD-10-CM | POA: Insufficient documentation

## 2020-05-12 LAB — COMPREHENSIVE METABOLIC PANEL
ALT: 17 U/L (ref 0–44)
AST: 29 U/L (ref 15–41)
Albumin: 3.7 g/dL (ref 3.5–5.0)
Alkaline Phosphatase: 68 U/L (ref 38–126)
Anion gap: 10 (ref 5–15)
BUN: 9 mg/dL (ref 6–20)
CO2: 23 mmol/L (ref 22–32)
Calcium: 9 mg/dL (ref 8.9–10.3)
Chloride: 104 mmol/L (ref 98–111)
Creatinine, Ser: 1.04 mg/dL (ref 0.61–1.24)
GFR calc Af Amer: >60 mL/min (ref >60)
GFR calc non Af Amer: >60 mL/min (ref >60)
Glucose, Bld: 91 mg/dL (ref 70–99)
Potassium: 3.7 mmol/L (ref 3.5–5.1)
Sodium: 137 mmol/L (ref 135–145)
Total Bilirubin: 0.6 mg/dL (ref 0.3–1.2)
Total Protein: 6.2 g/dL — ABNORMAL LOW (ref 6.5–8.1)

## 2020-05-12 LAB — RAPID URINE DRUG SCREEN, HOSP PERFORMED
Amphetamines: POSITIVE — AB
Barbiturates: NOT DETECTED
Benzodiazepines: NOT DETECTED
Cocaine: POSITIVE — AB
Opiates: NOT DETECTED
Tetrahydrocannabinol: POSITIVE — AB

## 2020-05-12 LAB — CBC
HCT: 45.5 % (ref 39.0–52.0)
Hemoglobin: 15.2 g/dL (ref 13.0–17.0)
MCH: 31.7 pg (ref 26.0–34.0)
MCHC: 33.4 g/dL (ref 30.0–36.0)
MCV: 94.8 fL (ref 80.0–100.0)
Platelets: 345 10*3/uL (ref 150–400)
RBC: 4.8 MIL/uL (ref 4.22–5.81)
RDW: 13.6 % (ref 11.5–15.5)
WBC: 7.9 10*3/uL (ref 4.0–10.5)
nRBC: 0 % (ref 0.0–0.2)

## 2020-05-12 LAB — SALICYLATE LEVEL: Salicylate Lvl: <7 mg/dL — ABNORMAL LOW (ref 7.0–30.0)

## 2020-05-12 LAB — ETHANOL: Alcohol, Ethyl (B): <10 mg/dL (ref <10)

## 2020-05-12 MED ORDER — ACETAMINOPHEN 325 MG PO TABS: 325.0000 mg | ORAL_TABLET | Freq: Once | ORAL | Status: DC
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
## 2020-05-12 NOTE — ED Notes (Addendum)
Called Poison control per Alexia Freestone  was advised to watch patient x 6 hrs (so 11:00 am) and labs drawn were sufficient and ekg

## 2020-09-15 ENCOUNTER — Other Ambulatory Visit: Payer: Self-pay

## 2020-09-15 ENCOUNTER — Telehealth: Payer: Self-pay

## 2020-09-15 ENCOUNTER — Ambulatory Visit: Payer: Self-pay | Admitting: Internal Medicine

## 2020-09-15 ENCOUNTER — Encounter: Payer: Self-pay | Admitting: Internal Medicine

## 2020-09-15 ENCOUNTER — Ambulatory Visit (INDEPENDENT_AMBULATORY_CARE_PROVIDER_SITE_OTHER): Payer: Self-pay | Admitting: Internal Medicine

## 2020-09-15 VITALS — BP 135/96 | HR 89 | Temp 98.0°F | Ht 70.0 in | Wt 171.0 lb

## 2020-09-15 DIAGNOSIS — B182 Chronic viral hepatitis C: Secondary | ICD-10-CM

## 2020-09-15 NOTE — Patient Instructions (Signed)
Thank you for coming to see me today. It was a pleasure seeing you.  To Do: [facility] today and I will let you know about the results. . Follow up as needed.  If you have any questions or concerns, please do not hesitate to call the office at [phone].  Take Care,   Gwynn Burly, DO

## 2020-09-15 NOTE — Telephone Encounter (Signed)
Attempted to reach patient and offer phone visit with Dr. Earlene Plater today. Left patient a  Voicemail to return call to [facility]. Valarie Cones

## 2020-09-15 NOTE — Progress Notes (Signed)
[facility]  CHIEF COMPLAINT:    Follow up for hepatitis C  SUBJECTIVE:    Reginald Lawson is a 45 y.o. male with PMHx as below who presents to the clinic for hepatitis C.   He finished Harvoni about 3 months ago after taking it for 8 weeks.  He had no issues on treatment and reports feeling well.  He has not engaged in any behaviors to place him at risk of reinfection.  Please see A&P for the details of today's visit and status of the patient's medical problems.   Patient's Medications  New Prescriptions   No medications on file  Previous Medications   LEDIPASVIR-SOFOSBUVIR (HARVONI) 90-400 MG TABS    Take 1 tablet by mouth daily.  Modified Medications   No medications on file  Discontinued Medications   No medications on file      Past Medical History:  Diagnosis Date  . Chronic hepatitis C (HCC)     Social History   Tobacco Use  . Smoking status: Current Every Day Smoker    Types: Cigarettes  . Smokeless tobacco: Current User    Types: Snuff  Substance Use Topics  . Alcohol use: Yes  . Drug use: Yes    Types: IV, Methamphetamines, Marijuana    Family History  Problem Relation Age of Onset  . Liver disease Neg Hx   . Liver cancer Neg Hx     No Known Allergies  Review of Systems  Constitutional: Negative.   Gastrointestinal: Negative.      OBJECTIVE:    Vitals:   09/15/20 1459  BP: (!) 135/96  Pulse: 89  Temp: 98 F (36.7 C)  Weight: 171 lb (77.6 kg)  Height: 5\' 10"  (1.778 m)   Body mass index is 24.54 kg/m.  Physical Exam Constitutional:      General: He is not in acute distress.    Appearance: Normal appearance.  Skin:    General: Skin is warm and dry.     Coloration: Skin is not jaundiced.  Neurological:     General: No focal deficit present.     Mental Status: He is alert and oriented to person, place, and time.      Labs and Microbiology: CBC Latest Ref Rng & Units 05/12/2020 04/09/2020  02/12/2020  WBC 4.0 - 10.5 K/uL 7.9 6.7 6.4  Hemoglobin 13.0 - 17.0 g/dL 04/14/2020 31.5 40.0  Hematocrit 39.0 - 52.0 % 45.5 47.2 47.4  Platelets 150 - 400 K/uL 345 540(H) 391   CMP Latest Ref Rng & Units 05/12/2020 04/09/2020 02/12/2020  Glucose 70 - 99 mg/dL 91 83 86  BUN 6 - 20 mg/dL 9 04/14/2020) 15  Creatinine 0.61 - 1.24 mg/dL <6(P 9.50 9.32  Sodium 135 - 145 mmol/L 137 140 140  Potassium 3.5 - 5.1 mmol/L 3.7 4.1 4.4  Chloride 98 - 111 mmol/L 104 105 109  CO2 22 - 32 mmol/L 23 21(L) 26  Calcium 8.9 - 10.3 mg/dL 9.0 9.1 9.6  Total Protein 6.5 - 8.1 g/dL 6.2(L) 7.4 7.3  Total Bilirubin 0.3 - 1.2 mg/dL 0.6 0.6 1.0  Alkaline Phos 38 - 126 U/L 68 74 -  AST 15 - 41 U/L 29 19 34  ALT 0 - 44 U/L 17 15 49(H)       ASSESSMENT & PLAN:    Chronic hepatitis C without hepatic coma (HCC) He as completed 8 weeks of Harvoni and now presents about 12  weeks after treatment for his test of cure.  Will repeat HCV RNA today and hepatic function panel to ensure normalization of LFT's.  If negative he can follow up as needed.    Orders Placed This Encounter  Procedures  . Hepatitis C RNA quantitative  . Hepatic function panel       Vedia Coffer for Infectious Disease [facility] 09/15/2020, 3:16 PM

## 2020-09-15 NOTE — Assessment & Plan Note (Signed)
He as completed 8 weeks of Harvoni and now presents about 12 weeks after treatment for his test of cure.  Will repeat HCV RNA today and hepatic function panel to ensure normalization of LFT's.  If negative he can follow up as needed.

## 2020-09-17 LAB — HEPATIC FUNCTION PANEL
AG Ratio: 1.7 (calc) (ref 1.0–2.5)
ALT: 11 U/L (ref 9–46)
AST: 16 U/L (ref 10–40)
Albumin: 4.8 g/dL (ref 3.6–5.1)
Alkaline phosphatase (APISO): 95 U/L (ref 36–130)
Bilirubin, Direct: 0.1 mg/dL (ref 0.0–0.2)
Globulin: 2.9 g/dL (calc) (ref 1.9–3.7)
Indirect Bilirubin: 0.3 mg/dL (calc) (ref 0.2–1.2)
Total Bilirubin: 0.4 mg/dL (ref 0.2–1.2)
Total Protein: 7.7 g/dL (ref 6.1–8.1)

## 2020-09-17 LAB — HEPATITIS C RNA QUANTITATIVE
HCV Quantitative Log: <1.18 log IU/mL
HCV RNA, PCR, QN: <15 IU/mL

## 2020-09-19 ENCOUNTER — Telehealth: Payer: Self-pay

## 2020-09-19 NOTE — Telephone Encounter (Signed)
Attempted to call patient to relay results per Dr. Earlene Plater, no answer. Left HIPAA compliant voicemail requesting callback.   Sandie Ano, RN

## 2020-09-19 NOTE — Telephone Encounter (Signed)
RN spoke with patient to relay that per Dr. Earlene Plater his hepatitis C RNA was negative that his liver enzyme labs have normalized and that he is considered cured of hepatitis C. Advised patient that there is risk of reinfection if prior risk factors, such as IV drug use, were to recur. Patient verbalized understanding and has no further questions.   Sandie Ano, RN

## 2020-09-19 NOTE — Telephone Encounter (Signed)
-----   Message from Kathlynn Grate, DO sent at 09/19/2020  9:57 AM EST ----- Please let patient know that his HCV RNA was negative and LFTs have normalized.  Since this was done 12 weeks after he completed treatment he is considered cured of Hep C but is at similar risk of reinfection if prior risk factors for Hep C were to recur.  He can just follow up with [place] as needed.   Thanks, Greig Castilla

## 2020-11-29 ENCOUNTER — Other Ambulatory Visit: Payer: Self-pay

## 2020-11-29 ENCOUNTER — Ambulatory Visit (HOSPITAL_COMMUNITY)
Admission: EM | Admit: 2020-11-29 | Discharge: 2020-11-30 | Disposition: A | Discharge status: Home / Self Care | Payer: No Payment, Other | Attending: Urology | Admitting: Urology

## 2020-11-29 DIAGNOSIS — Z20822 Contact with and (suspected) exposure to covid-19: Secondary | ICD-10-CM | POA: Diagnosis not present

## 2020-11-29 DIAGNOSIS — F259 Schizoaffective disorder, unspecified: Secondary | ICD-10-CM | POA: Insufficient documentation

## 2020-11-29 DIAGNOSIS — F1910 Other psychoactive substance abuse, uncomplicated: Secondary | ICD-10-CM | POA: Insufficient documentation

## 2020-11-29 LAB — LIPID PANEL
Cholesterol: 239 mg/dL — ABNORMAL HIGH (ref 0–200)
HDL: 60 mg/dL (ref >40)
LDL Cholesterol: 137 mg/dL — ABNORMAL HIGH (ref 0–99)
Total CHOL/HDL Ratio: 4 RATIO
Triglycerides: 209 mg/dL — ABNORMAL HIGH (ref <150)
VLDL: 42 mg/dL — ABNORMAL HIGH (ref 0–40)

## 2020-11-29 LAB — HEMOGLOBIN A1C
Hgb A1c MFr Bld: 6 % — ABNORMAL HIGH (ref 4.8–5.6)
Mean Plasma Glucose: 125.5 mg/dL

## 2020-11-29 LAB — CBC WITH DIFFERENTIAL/PLATELET
Abs Immature Granulocytes: 0.03 10*3/uL (ref 0.00–0.07)
Basophils Absolute: 0.1 10*3/uL (ref 0.0–0.1)
Basophils Relative: 1 %
Eosinophils Absolute: 0.6 10*3/uL — ABNORMAL HIGH (ref 0.0–0.5)
Eosinophils Relative: 6 %
HCT: 46.5 % (ref 39.0–52.0)
Hemoglobin: 15.7 g/dL (ref 13.0–17.0)
Immature Granulocytes: 0 %
Lymphocytes Relative: 30 %
Lymphs Abs: 3.1 10*3/uL (ref 0.7–4.0)
MCH: 31.3 pg (ref 26.0–34.0)
MCHC: 33.8 g/dL (ref 30.0–36.0)
MCV: 92.8 fL (ref 80.0–100.0)
Monocytes Absolute: 0.9 10*3/uL (ref 0.1–1.0)
Monocytes Relative: 9 %
Neutro Abs: 5.6 10*3/uL (ref 1.7–7.7)
Neutrophils Relative %: 54 %
Platelets: 427 10*3/uL — ABNORMAL HIGH (ref 150–400)
RBC: 5.01 MIL/uL (ref 4.22–5.81)
RDW: 12.9 % (ref 11.5–15.5)
WBC: 10.3 10*3/uL (ref 4.0–10.5)
nRBC: 0 % (ref 0.0–0.2)

## 2020-11-29 LAB — COMPREHENSIVE METABOLIC PANEL
ALT: 13 U/L (ref 0–44)
AST: 19 U/L (ref 15–41)
Albumin: 4.2 g/dL (ref 3.5–5.0)
Alkaline Phosphatase: 71 U/L (ref 38–126)
Anion gap: 12 (ref 5–15)
BUN: 15 mg/dL (ref 6–20)
CO2: 24 mmol/L (ref 22–32)
Calcium: 9.4 mg/dL (ref 8.9–10.3)
Chloride: 104 mmol/L (ref 98–111)
Creatinine, Ser: 1.19 mg/dL (ref 0.61–1.24)
GFR, Estimated: >60 mL/min (ref >60)
Glucose, Bld: 88 mg/dL (ref 70–99)
Potassium: 3.8 mmol/L (ref 3.5–5.1)
Sodium: 140 mmol/L (ref 135–145)
Total Bilirubin: 0.7 mg/dL (ref 0.3–1.2)
Total Protein: 7.1 g/dL (ref 6.5–8.1)

## 2020-11-29 LAB — POC SARS CORONAVIRUS 2 AG -  ED: SARS Coronavirus 2 Ag: NEGATIVE

## 2020-11-29 LAB — TSH: TSH: 3.938 u[IU]/mL (ref 0.350–4.500)

## 2020-11-29 LAB — RESP PANEL BY RT-PCR (FLU A&B, COVID) ARPGX2
Influenza A by PCR: NEGATIVE
Influenza B by PCR: NEGATIVE
SARS Coronavirus 2 by RT PCR: NEGATIVE

## 2020-11-29 LAB — POC SARS CORONAVIRUS 2 AG: SARSCOV2ONAVIRUS 2 AG: NEGATIVE

## 2020-11-29 LAB — ETHANOL: Alcohol, Ethyl (B): <10 mg/dL (ref <10)

## 2020-11-29 MED ORDER — ACETAMINOPHEN 325 MG PO TABS: 650.0000 mg | ORAL_TABLET | Freq: Four times a day (QID) | ORAL | Status: DC | PRN

## 2020-11-29 MED ORDER — LORAZEPAM 2 MG/ML IJ SOLN
INTRAMUSCULAR | Status: AC
  Administered 2020-11-29: 2 mg
  Filled 2020-11-29: qty 1

## 2020-11-29 MED ORDER — LORAZEPAM 2 MG/ML IJ SOLN: 2.0000 mg | Freq: Once | INTRAMUSCULAR | Status: DC

## 2020-11-29 MED ORDER — OLANZAPINE 5 MG PO TBDP
5.0000 mg | ORAL_TABLET | Freq: Two times a day (BID) | ORAL | Status: DC
  Administered 2020-11-30: 5 mg via ORAL
  Filled 2020-11-29: qty 1

## 2020-11-29 MED ORDER — ZIPRASIDONE MESYLATE 20 MG IM SOLR: 20.0000 mg | Freq: Once | INTRAMUSCULAR | Status: DC

## 2020-11-29 MED ORDER — ZIPRASIDONE MESYLATE 20 MG IM SOLR
INTRAMUSCULAR | Status: AC
  Administered 2020-11-29: 20 mg
  Filled 2020-11-29: qty 20

## 2020-11-29 MED ORDER — ZIPRASIDONE MESYLATE 20 MG IM SOLR
20.0000 mg | Freq: Once | INTRAMUSCULAR | Status: AC
  Administered 2020-11-29: 20 mg via INTRAMUSCULAR
  Filled 2020-11-29: qty 20

## 2020-11-29 MED ORDER — LORAZEPAM 1 MG PO TABS: 1.0000 mg | ORAL_TABLET | ORAL | Status: DC | PRN

## 2020-11-29 MED ORDER — LORAZEPAM 2 MG/ML IJ SOLN
1.0000 mg | Freq: Once | INTRAMUSCULAR | Status: AC
  Administered 2020-11-29: 1 mg via INTRAMUSCULAR
  Filled 2020-11-29: qty 1

## 2020-11-29 MED ORDER — OLANZAPINE 5 MG PO TBDP: 5.0000 mg | ORAL_TABLET | Freq: Every day | ORAL | Status: DC

## 2020-11-29 MED ORDER — LORAZEPAM 1 MG PO TABS: 2.0000 mg | ORAL_TABLET | Freq: Once | ORAL | Status: DC

## 2020-11-29 MED ORDER — MAGNESIUM HYDROXIDE 400 MG/5ML PO SUSP: 30.0000 mL | Freq: Every day | ORAL | Status: DC | PRN

## 2020-11-29 MED ORDER — LORAZEPAM 2 MG/ML IJ SOLN: 1.0000 mg | Freq: Once | INTRAMUSCULAR | Status: DC

## 2020-11-29 MED ORDER — HYDROXYZINE HCL 25 MG PO TABS
25.0000 mg | ORAL_TABLET | Freq: Three times a day (TID) | ORAL | Status: DC | PRN
  Administered 2020-11-30: 25 mg via ORAL
  Filled 2020-11-29 (×3): qty 1

## 2020-11-29 MED ORDER — OLANZAPINE 5 MG PO TBDP
5.0000 mg | ORAL_TABLET | Freq: Every day | ORAL | Status: DC
  Administered 2020-11-29: 5 mg via ORAL
  Filled 2020-11-29 (×3): qty 1

## 2020-11-29 MED ORDER — TRAZODONE HCL 50 MG PO TABS
50.0000 mg | ORAL_TABLET | Freq: Every evening | ORAL | Status: DC | PRN
  Filled 2020-11-29: qty 1

## 2020-11-29 MED ORDER — ALUM & MAG HYDROXIDE-SIMETH 200-200-20 MG/5ML PO SUSP: 30.0000 mL | ORAL | Status: DC | PRN

## 2020-11-29 MED ORDER — ZIPRASIDONE MESYLATE 20 MG IM SOLR: 20.0000 mg | INTRAMUSCULAR | Status: DC | PRN

## 2020-11-29 NOTE — ED Notes (Signed)
Pt is agitated, demanding to leave facility.  NP Ene Ajibola at bedside to speak with pt.  Pending IVC.  IM injections ordered for pt.

## 2020-11-29 NOTE — ED Notes (Signed)
He went to the bathroom came back to his bed stood there then decided to go to the bed next to him that is already occupied with a male patient and tried to lay in bed with her.Staff then redirected him to the right area.

## 2020-11-29 NOTE — ED Notes (Addendum)
Pt delusional and hallucinating hearing voices and seeing people all over his back.  Denies SI or HI.  Pt very resistant to care, demanding to leave facility.  Meds given, pt resting at present. No distress noted, calm & cooperative at present.  Skin search completed.  Monitoring for safety.  Pending IVC by NP Ene Ajibola.

## 2020-11-29 NOTE — ED Notes (Addendum)
Pt was observed by staff attempting to get into bed with a male pt. Pt was redirected and Provider was notified.
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
## 2020-11-29 NOTE — ED Notes (Addendum)
Pt freely tolerated injections without incident, Sheriff and Security on standby as needed. Pt escorted to unit without incident.

## 2020-11-29 NOTE — ED Notes (Signed)
Pt at nurses desk hitting the plexi glass window, yelling and demanding to go home. Explained to Pt that the decision to leave is up to the provider. Pt became more enraged and continued to yell even more along with hitting the plexi glass window. Stating to this nurse, "Fuck you, fuck you, fuck you",  As he was giving this nurse the middle finger. Pt began yelling at another Pt at this time also. Spoke with the provider face to face, informed her that the Pt was IVC'd. Received IM orders to admin Ativan and Geodon at that time. Security called prior to receiving IM orders. Removed other Pt's from the assessment area to the flex unit for safety. Admin IM meds per providers orders with assistance from the other nurse on duty. Security at the bedside for assistance. Pt still stating that he wants to go home and that we should not be keeping him. Pt informed that he is IVC'd from security. Safety maintained and will continue to monitor.

## 2020-11-29 NOTE — Progress Notes (Signed)
Per Stefani Dama, NP, patient meets criteria for inpatient treatment. There are no available or appropriate beds at [facility] today. CSW faxed referrals to the following facilities for review:  [place]  [facility]  TTS will continue to seek bed placement.  Crissie Reese, MSW, LCSW-A, LCAS-A Phone: [phone] Disposition/TOC

## 2020-11-29 NOTE — ED Notes (Signed)
Pt resting on pull out bed w/o any distress. Safety maintained and will continue to monitor. 

## 2020-11-29 NOTE — Progress Notes (Signed)
IM Ativan and Geodon were administered with no incident. Pt currently sleeping. No signs of acute distress noted. Respirations are even and unlabored. Staff will monitor for pt's safety.

## 2020-11-29 NOTE — ED Notes (Signed)
Patient received in [facility] adult wing, bed 4 asleep but arousable.  Demonstrated ability to respond appropriately to name and date of birth.  Otherwise, slightly agitated with arousal attempts.  Quickly settled back to sleep.  No s/sx pain, discomfort or disturbance.  Nursing will continue to monitor closely.  No further needs or concerns apparent at the time of this writing.

## 2020-11-30 MED ORDER — OLANZAPINE 5 MG PO TBDP: 5.0000 mg | ORAL_TABLET | Freq: Two times a day (BID) | ORAL | 0 refills | Status: AC

## 2020-11-30 NOTE — ED Notes (Signed)
NP speaking with pt.

## 2020-11-30 NOTE — ED Notes (Signed)
Pt given breakfast.

## 2020-11-30 NOTE — ED Notes (Addendum)
Patient stable, in no apparent distress and is resting comfortably.  No needs or concerns apparent at the time of this writing.  Nursing will continue to monitor. 

## 2020-11-30 NOTE — ED Notes (Signed)
Patient stable, in no apparent distress and is resting comfortably. Nursing will continue to monitor.

## 2020-11-30 NOTE — Discharge Instructions (Signed)
Patient is instructed prior to discharge to:  Take all medications as prescribed by his/her mental healthcare provider. Report any adverse effects and or reactions from the medicines to his/her outpatient provider promptly. Keep all scheduled appointments, to ensure that you are getting refills on time and to avoid any interruption in your medication.  If you are unable to keep an appointment call to reschedule.  Be sure to follow-up with resources and follow-up appointments provided.  Patient has been instructed & cautioned: To not engage in alcohol and or illegal drug use while on prescription medicines. In the event of worsening symptoms, patient is instructed to call the crisis hotline, 911 and or go to the nearest ED for appropriate evaluation and treatment of symptoms. To follow-up with his/her primary care provider for your other medical issues, concerns and or health care needs.    Substance Abuse Treatment Resources listed Below:  [facility] - Admissions are currently completed Monday through Friday at 8am; both appointments and walk-ins are accepted.  Any individual that is a [place] resident may present for a substance abuse screening and assessment for admission.  A person may be referred by numerous sources or self-refer.   Potential clients will be screened for medical necessity and appropriateness for the program.  Clients must meet criteria for high-intensity residential treatment services.  If clinically appropriate, a client will continue with the comprehensive clinical assessment and intake process, as well as enrollment in the [facility].  Address: [address] [place], [place] [address] Admin Hours: Mon-Fri 8AM to 5PM Center Hours: 24/7 Phone: [phone] Fax: [phone]  [facility] Address: [address], [place], [place] [address] Behavioral [facility] Urgent Care ([facility]) Hours: 24/7 Phone:  [phone] Fax: [phone]  [facility]): (offers outpatient therapy and intensive outpatient substance abuse therapy).  [address], [place], [place] [address] Phone: [phone]  [facility]: Offers FREE recovery skills classes, support groups, 1:1 Peer Support, and Compeer Classes. [address], [place], [place] [address] Phone: [phone] (Call to complete intake).   [facility] [address] [place], [place] [address] Phone: [phone] ext [phone] [facility] provides food, shelter and other programs and services to the homeless men of [place] through our men's program.  By offering safe shelter, three meals a day, clean clothing, Biblical counseling, financial planning, vocational training, GED/education and employment assistance, we've helped mend the shattered lives of many homeless men since opening in 1974.  We have approximately 267 beds available, with a max of 312 beds including mats for emergency situations and currently house an average of 270 men a night.  Prospective Client Check-In Information Photo ID Required (State/ Out of State/ DOC) - if photo ID is not available, clients are required to have a printout of a police/sheriff's criminal history report. Help out with chores around the Mission. No sex offender of any type (pending, charged, registered and/or any other sex related offenses) will be permitted to check in. Must be willing to abide by all rules, regulations, and policies established by the [facility]. The following will be provided - shelter, food, clothing, and biblical counseling. If you or someone you know is in need of assistance at our men's shelter in [place], [place], please call [phone] ext. [phone].  [facility]-will provide timely access to mental health services for children and adolescents (4-17) and adults    presenting in a mental health crisis. The program is designed for those who need urgent Behavioral Health or Substance Use treatment and are not experiencing a medical crisis that would typically require an emergency room visit.    [address] [place], [place] [address] Phone: [phone] [email or web address]  [facility]: Phone#: [phone]  The [facility] SA Intensive Outpatient Program (SAIOP) means structured individual and group addiction activities and services that are provided at an outpatient program designed to assist adult and adolescent consumers to begin recovery and learn skills for recovery maintenance. The [facility] SAIOP program is offered at least 3 hours a day, 3 days a week.SAIOP services shall include a structured program consisting of, but not limited to, the following services: Individual counseling and support; Group counseling and support; Family counseling, training or support; Biochemical assays to identify recent drug use (e.g., urine drug screens); Strategies for relapse prevention to include community and social support systems in treatment; Life skills; Crisis contingency planning; Disease Management; and Treatment support activities that have been adapted or specifically designed for persons with physical disabilities, or persons with co-occurring disorders of mental illness and substance abuse/dependence or mental retardation/developmental disability and substance abuse/dependence. Phone: [phone]  Address:   [facility] will also offer the following outpatient services: (Monday through Friday 8am-5pm)   Partial Hospitalization Program (PHP) Substance Abuse Intensive Outpatient Program (SA-IOP) Group Therapy Medication Management Peer Living Room We also provide (24/7):  Assessments: Our mental health clinician and providers will conduct a focused mental health evaluation, assessing for immediate  safety concerns and further mental health needs. Referral: Our team will provide resources and help connect to community based mental health treatment, when indicated, including psychotherapy, psychiatry, and other specialized behavioral health or substance use disorder services (for those not already in treatment). Transitional Care: Our team providers in person bridging and/or telephonic follow-up during the patient's transition to outpatient services.  [facility] 24-Hour Call Center: [phone] Behavioral Health Crisis Line: [phone]  
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
## 2020-11-30 NOTE — Progress Notes (Signed)
Pt continues to sleep. Respirations are even and unlabored. No signs of acute distress noted. Pt's safety is maintained.

## 2020-11-30 NOTE — ED Provider Notes (Signed)
FBC/OBS ASAP Discharge Summary  Date and Time: 11/30/2020 2:43 PM  Name: Reginald Lawson  MRN:  [id number]   Discharge Diagnoses:  Final diagnoses:  Schizoaffective disorder, unspecified type (HCC)  Polysubstance abuse (HCC)    Subjective: Reginald Lawson reports frustration that he remains in hospital.  He states "I am fine and I would like to go home."  Stay Summary:  Pain assessed by nurse practitioner.  He is alert and oriented, answers appropriately.  He has exhibited irritable behavior toward peers as well as staff.  He denies suicidal and homicidal ideations currently.  He denies auditory and visual hallucinations.  There is no evidence of delusional thought content.  He denies symptoms of paranoia.  He contracts verbally for safety with this Clinical research associate.  He appears to minimize substance use.  He reports he is not currently interested in substance use treatment options.  He states "I have been to [facility] twice, after you relapse why would you keep going back."  He reports he resides in [place].  He reports he is employed.  He refuses to answer regarding alcohol and substance use.  He endorses average sleep and appetite.  Reginald Lawson reports he is not currently followed by outpatient psychiatry.  Discussed continuing medications, verbalizes agreement.  Discussed follow-up with outpatient at [facility], he verbalizes agreement.  Patient offered support and encouragement.  He gives verbal consent to speak with friend, Reginald Lawson, [phone]. Spoke with Reginald Lawson who reports he is a previous employer of Reginald Lawson.  He states "we feel sorry for him, but we have done everything we can for him, he will likely only stay clean for a few days once he leaves."  He denies concern for patient safety, reports patient would not harm himself or anyone else.  He reports he will not be willing to pick up patient today as he has done so in the past.  11/29/2020- 0400HPI: Reginald Lawson is a 45  y/o male. Patient presented with complaint of visual and auditory hallucination. Patient presented voluntarily via Patent examiner. Patient report "I went to the court house to take out papers on the board of election examiner in my head, body and teeth and I was told to come here for help."    Patient endorses hallucination and states "I have had ringing in his ears and people living in his body, head and teeth for over 29months." he report that hallucination is worse when using drugs. He endorses daily use of Meth and report that he uses about $30-40 worth per day.    Patient is assessed face to face by this NP. Patient is tangential with flights of ideas, his thought process is disorganized. He is alert and orient X2, he is disoriented to time/date and situation. His speech is rapid/pressured. Patient denies complaint of SOB, chest pain/discomfort. No headache, dizziness, fevers, chills, GI/GU symptoms.    Total Time spent with patient: 30 minutes  Past Psychiatric History: Stimulant dependence, schizoaffective disorder, alcohol dependence, stimulant induced psychotic disorder Past Medical History:  Past Medical History:  Diagnosis Date  . Chronic hepatitis C (HCC)     Past Surgical History:  Procedure Laterality Date  . NO PAST SURGERIES     Family History:  Family History  Problem Relation Age of Onset  . Liver disease Neg Hx   . Liver cancer Neg Hx    Family Psychiatric History: None reported Social History:  Social History   Substance and Sexual Activity  Alcohol Use Yes  Social History   Substance and Sexual Activity  Drug Use Yes  . Types: IV, Methamphetamines, Marijuana    Social History   Socioeconomic History  . Marital status: Single    Spouse name: Not on file  . Number of children: Not on file  . Years of education: Not on file  . Highest education level: Not on file  Occupational History  . Not on file  Tobacco Use  . Smoking status: Current Every  Day Smoker    Types: Cigarettes  . Smokeless tobacco: Current User    Types: Snuff  Substance and Sexual Activity  . Alcohol use: Yes  . Drug use: Yes    Types: IV, Methamphetamines, Marijuana  . Sexual activity: Not on file  Other Topics Concern  . Not on file  Social History Narrative  . Not on file   Social Determinants of Health   Financial Resource Strain: Not on file  Food Insecurity: Not on file  Transportation Needs: Not on file  Physical Activity: Not on file  Stress: Not on file  Social Connections: Not on file   SDOH:  SDOH Screenings   Alcohol Screen: Not on file  Depression (PHQ2-9): Low Risk   . PHQ-2 Score: 0  Financial Resource Strain: Not on file  Food Insecurity: Not on file  Housing: Not on file  Physical Activity: Not on file  Social Connections: Not on file  Stress: Not on file  Tobacco Use: High Risk  . Smoking Tobacco Use: Current Every Day Smoker  . Smokeless Tobacco Use: Current User  Transportation Needs: Not on file    Has this patient used any form of tobacco in the last 30 days? (Cigarettes, Smokeless Tobacco, Cigars, and/or Pipes) A prescription for an FDA-approved tobacco cessation medication was offered at discharge and the patient refused  Current Medications:  Current Facility-Administered Medications  Medication Dose Route Frequency Provider Last Rate Last Admin  . acetaminophen (TYLENOL) tablet 650 mg  650 mg Oral Q6H PRN Ajibola, Ene A, NP      . alum & mag hydroxide-simeth (MAALOX/MYLANTA) 200-200-20 MG/5ML suspension 30 mL  30 mL Oral Q4H PRN Ajibola, Ene A, NP      . hydrOXYzine (ATARAX/VISTARIL) tablet 25 mg  25 mg Oral TID PRN Ajibola, Ene A, NP   25 mg at 11/30/20 0748  . LORazepam (ATIVAN) tablet 2 mg  2 mg Oral Once Oneta RackLewis, Tanika N, NP       Or  . LORazepam (ATIVAN) injection 2 mg  2 mg Intramuscular Once Oneta RackLewis, Tanika N, NP      . magnesium hydroxide (MILK OF MAGNESIA) suspension 30 mL  30 mL Oral Daily PRN Ajibola, Ene  A, NP      . OLANZapine zydis (ZYPREXA) disintegrating tablet 5 mg  5 mg Oral BID Oneta RackLewis, Tanika N, NP   5 mg at 11/30/20 0748  . traZODone (DESYREL) tablet 50 mg  50 mg Oral QHS PRN Ajibola, Ene A, NP      . ziprasidone (GEODON) injection 20 mg  20 mg Intramuscular Once Oneta RackLewis, Tanika N, NP       No current outpatient medications on file.    PTA Medications: (Not in a hospital admission)   Musculoskeletal  Strength & Muscle Tone: within normal limits Gait & Station: normal Patient leans: N/A  Psychiatric Specialty Exam  Presentation  General Appearance: Appropriate for Environment  Eye Contact:Fleeting  Speech:Pressured  Speech Volume:Normal  Handedness:Right   Mood and Affect  Mood:Anxious  Affect:Congruent   Thought Process  Thought Processes:Disorganized  Descriptions of Associations:Tangential  Orientation:Partial (disoriented to situation and time)  Thought Content:WDL     Hallucinations:Hallucinations: Auditory; Visual Description of Auditory Hallucinations: "ringing in the ear and board election examiner telling me to kill myself." Description of Visual Hallucinations: "seeing faces and people inside of me"  Ideas of Reference:Paranoia  Suicidal Thoughts:Suicidal Thoughts: No  Homicidal Thoughts:Homicidal Thoughts: No   Sensorium  Memory:Immediate Fair; Recent Fair; Remote Poor  Judgment:Impaired  Insight:Lacking   Executive Functions  Concentration:Poor  Attention Span:Fair  Recall:Fair  Fund of Knowledge:Fair  Language:Good   Psychomotor Activity  Psychomotor Activity:Psychomotor Activity: Normal   Assets  Assets:Desire for Improvement; Housing; Health and safety inspector; Vocational/Educational   Sleep  Sleep:Sleep: Fair Number of Hours of Sleep: 6   Nutritional Assessment (For OBS and FBC admissions only) Has the patient had a weight loss or gain of 10 pounds or more in the last 3 months?: No Has the patient had a  decrease in food intake/or appetite?: No Does the patient have dental problems?: No Does the patient have eating habits or behaviors that may be indicators of an eating disorder including binging or inducing vomiting?: No Has the patient recently lost weight without trying?: No Has the patient been eating poorly because of a decreased appetite?: No Malnutrition Screening Tool Score: 0    Physical Exam  Physical Exam Vitals and nursing note reviewed.  Constitutional:      Appearance: He is well-developed.  HENT:     Head: Normocephalic.     Nose: Nose normal.  Cardiovascular:     Rate and Rhythm: Normal rate.  Pulmonary:     Effort: Pulmonary effort is normal.  Musculoskeletal:        General: Normal range of motion.  Neurological:     Mental Status: He is alert and oriented to person, place, and time.  Psychiatric:        Attention and Perception: Attention and perception normal.        Mood and Affect: Mood is anxious. Affect is labile.        Speech: Speech normal.        Behavior: Behavior is cooperative.        Thought Content: Thought content normal.        Cognition and Memory: Cognition and memory normal.        Judgment: Judgment normal.    Review of Systems  Constitutional: Negative.   HENT: Negative.   Eyes: Negative.   Respiratory: Negative.   Cardiovascular: Negative.   Gastrointestinal: Negative.   Genitourinary: Negative.   Musculoskeletal: Negative.   Skin: Negative.   Neurological: Negative.   Endo/Heme/Allergies: Negative.   Psychiatric/Behavioral: Positive for substance abuse. The patient is nervous/anxious.    Blood pressure 114/80, pulse 90, temperature 98.5 F (36.9 C), temperature source Oral, resp. rate 16, SpO2 99 %. There is no height or weight on file to calculate BMI.  Demographic Factors:  Male, Caucasian, Low socioeconomic status and Living alone  Loss Factors: NA  Historical Factors: NA  Risk Reduction Factors:   Employed,  Positive social support, Positive therapeutic relationship and Positive coping skills or problem solving skills  Continued Clinical Symptoms:  Alcohol/Substance Abuse/Dependencies  Cognitive Features That Contribute To Risk:  None    Suicide Risk:  Minimal: No identifiable suicidal ideation.  Patients presenting with no risk factors but with morbid ruminations; may be classified as minimal risk based on the severity of the  depressive symptoms  Plan Of Care/Follow-up recommendations:  Other:  Patient reviewed with Dr. Jola Babinski.  Follow-up with substance use treatment resources provided. Follow-up with outpatient psychiatry, resources provided. Medication: -Zyprexa 5 mg twice daily/mood  Disposition: Discharge  Lenard Lance, FNP 11/30/2020, 2:43 PM
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
## 2020-11-30 NOTE — ED Notes (Signed)
Pt discharged with belongings returned intact to locker #31. Pt stated, "it seem like nobody around here know what they are doing or what the other person is doing? I just need to be out of here". Reviewed instructions on AVS to include RX (Zyprexa). Verbalized understanding of instructions. Pt escorted to lobby with bus pass in hand. Safety maintained.

## 2020-11-30 NOTE — Progress Notes (Signed)
Pt woke up agitated and demanding to leave. Pt was informed that the provider will assess him and make a determination. Administered scheduled Zyprexa and PRN Vistaril with no incident. Pt is presently sleeping. Respirations are even and unlabored. No signs of acute distress noted. Staff will monitor for pt's safety.

## 2020-12-03 NOTE — BH Assessment (Signed)
Comprehensive Clinical Assessment (CCA) Note  12/03/2020 Reginald Lawson [id number]  Recommendations for Services/Supports/Treatments: Reginald Reasoner, NP, reviewed pt's chart and information and met with pt and determined pt should be observed overnight for safety and stability and re-assessed in the morning by psychiatry.  The patient demonstrates the following risk factors for suicide: Chronic risk factors for suicide include: substance use disorder, previous suicide attempts in the past and demographic factors (male, >28 y/o). Acute risk factors for suicide include: loss (financial, interpersonal, professional). Protective factors for this patient include: stable housing. Considering these factors, the overall suicide risk at this point appears to be none. Patient is not appropriate for outpatient follow up.  Therefore, no sitter is recommended for suicide precautions.  [place] [facility] from 11/29/2020 in [facility] ED from 05/12/2020 in [place] ED from 04/09/2020 in [place] No Risk High Risk High Risk     Chief Complaint:  Chief Complaint  Patient presents with  . Delusional  . 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 Pt shares with clinician that he and the Board of Electrical Examiners have a court date on April 28; he essentially shares they have been spying on him.  Pt denies SI, though he acknowledges he's experienced SI in the past. Pt states he's attempted to kill himself 4-5 times in the past but denies he  currently has a plan to kill himself. Pt shares he's been hospitalized in the past for EtOH and SA in the past.  Pt shared paranoia and delusions re: the Board of Arts development officer. He acknowledges he's been using $30 - $40 of methamphetamine daily. He attempts to show clinician and NP the people that are inside his body by standing in the hallway, slowly moving, and spinning around.  Pt's orientation is UTA. Pt's recent/remote memory was UTA. Pt was cooperative throughout the assessment process. Pt's insight, judgement, and impulse control is poor at this time.  Patient Reported Information How did you hear about [place]? Legal System  Referral name: [employer]  Referral phone number: 0 (N/A)   Whom do you see for routine medical problems? -- (UTA)  Practice/Facility Name: No data recorded Practice/Facility Phone Number: No data recorded Name of Contact: No data recorded Contact Number: No data recorded Contact Fax Number: No data recorded Prescriber Name: No data recorded Prescriber Address (if known): No data recorded  What Is the Reason for Your Visit/Call Today? Pt was at the Magistrate's attempting to press charges against the bodies inside him and the voices he hears. The police brought him to the [facility]. Pt acknowledges daily meth use.  How Long Has This Been Causing You Problems? 1 wk - 1 month  What Do You Feel Would Help You the Most Today? Treatment for Depression or other mood problem; Alcohol or Drug Use Treatment (Pt would like the people removed from his body)   Have You Recently Been in Any Inpatient Treatment (Hospital/Detox/Crisis Center/28-Day Program)? -- (UTA)  Name/Location of Program/Hospital:No data recorded How Long Were You There? No data recorded When Were You Discharged? No data recorded  Have You Ever  Received Services From [facility] Before? Yes  Who Do You See at [facility]? Pt sees providers at Infectious Disease for 'Chronic Hepatitis C"   Have You  Recently Had Any Thoughts About Hurting Yourself? No  Are You Planning to Commit Suicide/Harm Yourself At This time? No   Have you Recently Had Thoughts About Hurt? Yes  Explanation: No data recorded  Have You Used Any Alcohol or Drugs in the Past 24 Hours? Yes  How Long Ago Did You Use Drugs or Alcohol? No data recorded What Did You Use and How Much? Pt shares he uses $30 - $40 of meth daily   Do You Currently Have a Therapist/Psychiatrist? -- (UTA)  Name of Therapist/Psychiatrist: No data recorded  Have You Been Recently Discharged From Any Office Practice or Programs? -- (UTA)  Explanation of Discharge From Practice/Program: No data recorded    CCA Screening Triage Referral Assessment Type of Contact: Face-to-Face  Is this Initial or Reassessment? No data recorded Date Telepsych consult ordered in [facility]:  No data recorded Time Telepsych consult ordered in [facility]:  No data recorded  Patient Reported Information Reviewed? Yes  Patient Left Without Being Seen? No data recorded Reason for Not Completing Assessment: No data recorded  Collateral Involvement: Pt provided verbal consent for clinician to make contact with Huntley Dec, an Chief Financial Officer, at [phone]. This person was not contacted due to pt's current mental status and his inability to consent.   Does Patient Have a Stage manager Guardian? No data recorded Name and Contact of Legal Guardian: No data recorded If Minor and Not Living with Parent(s), Who has Custody? N/A  Is CPS involved or ever been involved? -- (UTA)  Is APS involved or ever been involved? -- (UTA)   Patient Determined To Be At Risk for Harm To Self or Others Based on Review of Patient Reported Information or Presenting Complaint? No  Method: No data recorded Availability of Means: No data recorded Intent: No data recorded Notification Required: No data recorded Additional Information for Danger to Others  Potential: No data recorded Additional Comments for Danger to Others Potential: No data recorded Are There Guns or Other Weapons in Your Home? No data recorded Types of Guns/Weapons: No data recorded Are These Weapons Safely Secured?                            No data recorded Who Could Verify You Are Able To Have These Secured: No data recorded Do You Have any Outstanding Charges, Pending Court Dates, Parole/Probation? No data recorded Contacted To Inform of Risk of Harm To Self or Others: -- (N/A)   Location of Assessment: [facility] Assessment Services   Does Patient Present under Involuntary Commitment? No (However, pt was IVCed by clinician due to concerns re: his mental state.)  IVC Papers Initial File Date: No data recorded  [place] of Residence: [place]   Patient Currently Receiving the Following Services: Not Receiving Services   Determination of Need: Urgent (48 hours)   Options For Referral: Chemical Dependency Intensive Outpatient Therapy (CDIOP)     CCA Biopsychosocial Intake/Chief Complaint:  Pt was at the Magistrate's attempting to press charges against the bodies inside him and the voices he hears. The police brought him to the [facility]. Pt acknowledges daily meth use.  Current Symptoms/Problems: Pt is exhibiting paranoia and shares there are bodies inside of him. Pt attempts to show clinician and NP the  bodies following him by going into the hallway and moving/turning around slowly. He states the Restaurant manager, fast food are spying on him.   Patient Reported Schizophrenia/Schizoaffective Diagnosis in Past: -- (UTA)   Strengths: N/A  Preferences: N/A  Abilities: N/A   Type of Services Patient Feels are Needed: UTA   Initial Clinical Notes/Concerns: Pt needs to get back on medication   Mental Health Symptoms Depression:  Difficulty Concentrating   Duration of Depressive symptoms: Greater than two weeks   Mania:  Change in energy/activity;  Irritability; Racing thoughts   Anxiety:   Difficulty concentrating; Fatigue; Irritability; Worrying   Psychosis:  Delusions; Hallucinations   Duration of Psychotic symptoms: Greater than six months   Trauma:  N/A   Obsessions:  Recurrent & persistent thoughts/impulses/images; Poor insight   Compulsions:  N/A   Inattention:  N/A   Hyperactivity/Impulsivity:  N/A   Oppositional/Defiant Behaviors:  N/A   Emotional Irregularity:  Intense/inappropriate anger; Potentially harmful impulsivity; Recurrent suicidal behaviors/gestures/threats; Transient, stress-related paranoia/disassociation   Other Mood/Personality Symptoms:  N/A    Mental Status Exam Appearance and self-care  Stature:  Average   Weight:  Average weight   Clothing:  Dirty (Pt in scrubs)   Grooming:  Normal   Cosmetic use:  None   Posture/gait:  Bizarre   Motor activity:  Not Remarkable   Sensorium  Attention:  Normal   Concentration:  Preoccupied   Orientation:  -- (UTA)   Recall/memory:  Normal   Affect and Mood  Affect:  Anxious   Mood:  Anxious   Relating  Eye contact:  Normal   Facial expression:  Anxious   Attitude toward examiner:  Cooperative   Thought and Language  Speech flow: Normal   Thought content:  Delusions; Suspicious   Preoccupation:  Other (Comment) (Paranoia)   Hallucinations:  Auditory   Organization:  No data recorded  [employer] of Knowledge:  -- (UTA)   Intelligence:  -- (UTA)   Abstraction:  -- (UTA)   Judgement:  Poor   Reality Testing:  -- (UTA)   Insight:  Lacking; Poor; Shallow   Decision Making:  Paralyzed   Social Functioning  Social Maturity:  -- Special educational needs teacher)   Social Judgement:  -- Special educational needs teacher)   Stress  Stressors:  Grief/losses   Coping Ability:  Programme researcher, broadcasting/film/video Deficits:  Lobbyist   Supports:  Support needed     Religion: Religion/Spirituality Are You A Religious Person?: No How Might This  Affect Treatment?: N/A  Leisure/Recreation: Leisure / Recreation Do You Have Hobbies?: No  Exercise/Diet: Exercise/Diet Do You Exercise?: No Have You Gained or Lost A Significant Amount of Weight in the Past Six Months?:  (UTA) Do You Follow a Special Diet?: No Do You Have Any Trouble Sleeping?: No   CCA Employment/Education Employment/Work Situation: Employment / Work Copywriter, advertising Employment situation: (P)  (UTA) Where is patient currently employed?: (P) UTA How long has patient been employed?: (P) Not assessed Patient's job has been impacted by current illness:  (UTA) What is the longest time patient has a held a job?: Pt reported, 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 Has patient ever been in the [employer]?: No  Education: Education Last Grade Completed: 12 Name of High School:  ([place]) Did You Graduate From [employer]?: Yes Did [employer]?: No Did You  Attend Graduate School?: No Did You Have Any Special Interests In School?:  (UTA) Did You Have An Individualized Education Program (IIEP):  (UTA) Did You Have Any Difficulty At School?:  (UTA)   CCA Family/Childhood History Family and Relationship History: Family history Marital status: Single (UTA) Are you sexually active?:  (Not assessed) What is your sexual orientation?: Not assessed Has your sexual activity been affected by drugs, alcohol, medication, or emotional stress?: Not assessed Does patient have children?: No  Childhood History:  Childhood History By whom was/is the patient raised?:  (UTA) Additional childhood history information: Not assessed Description of patient's relationship with caregiver when they were a child: Not assessed Patient's description of current relationship with people who raised him/her: Not assessed How were you disciplined when you got in trouble as a  child/adolescent?: Not assessed Does patient have siblings?: Yes Number of Siblings: 1 Description of patient's current relationship with siblings: UTA Did patient suffer any verbal/emotional/physical/sexual abuse as a child?: No Did patient suffer from severe childhood neglect?: No Has patient ever been sexually abused/assaulted/raped as an adolescent or adult?: No Was the patient ever a victim of a crime or a disaster?: No Witnessed domestic violence?: No Has patient been affected by domestic violence as an adult?: No  Child/Adolescent Assessment:     CCA Substance Use Alcohol/Drug Use: Alcohol / Drug Use Pain Medications: See MAR Prescriptions: See MAR Over the Counter: See MAR History of alcohol / drug use?: Yes Longest period of sobriety (when/how long): Unknown Negative Consequences of Use:  (UTA) Withdrawal Symptoms:  (UTA) Substance #1 Name of Substance 1: Amphetamine 1 - Age of First Use: [age] 1 - Amount (size/oz): $30 - $40 1 - Frequency: Daily 1 - Duration: Unknown 1 - Last Use / Amount: Today 1 - Method of Aquiring: Purchase 1- Route of Use: Not assessed                       ASAM's:  Six Dimensions of Multidimensional Assessment  Dimension 1:  Acute Intoxication and/or Withdrawal Potential:   Dimension 1:  Description of individual's past and current experiences of substance use and withdrawal: UTA  Dimension 2:  Biomedical Conditions and Complications:   Dimension 2:  Description of patient's biomedical conditions and  complications: Pt has chronic Hepatitas C  Dimension 3:  Emotional, Behavioral, or Cognitive Conditions and Complications:  Dimension 3:  Description of emotional, behavioral, or cognitive conditions and complications: Pt is currently unable to differentiate between reality and his delusions  Dimension 4:  Readiness to Change:  Dimension 4:  Description of Readiness to Change criteria: Pt is not requesting with help to abstain from  the use of substances at this time.  Dimension 5:  Relapse, Continued use, or Continued Problem Potential:  Dimension 5:  Relapse, continued use, or continued problem potential critiera description: Pt is not requesting with help to abstain from the use of substances at this time.  Dimension 6:  Recovery/Living Environment:  Dimension 6:  Recovery/Iiving environment criteria description: Pt states he lives alone  ASAM Severity Score: ASAM's Severity Rating Score: 14  ASAM Recommended Level of Treatment: ASAM Recommended Level of Treatment: Level III Residential Treatment   Substance use Disorder (SUD) Substance Use Disorder (SUD)  Checklist Symptoms of Substance Use: Evidence of tolerance  Recommendations for Services/Supports/Treatments: Recommendations for Services/Supports/Treatments Recommendations For Services/Supports/Treatments: Individual Therapy,Medication Management,Other (Comment) (Over[facility] Observation at the [facility])  Reginald Reasoner, NP, reviewed pt's chart and information and met  with pt and determined pt should be observed overnight for safety and stability and re-assessed in the morning by psychiatry.  DSM5 Diagnoses: Patient Active Problem List   Diagnosis Date Noted  . Chronic hepatitis C without hepatic coma ([place]) 02/12/2020  . Uninsured 02/12/2020  . Uncomplicated alcohol dependence ([place]) 12/21/2019  . Other stimulant dependence, uncomplicated ([place]) 22/48/2500  . Schizoaffective disorder, depressive type ([place]) 12/21/2019  . 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

## 2021-01-04 IMAGING — DX DG ABDOMEN 1V
1 series · 1 of 1 positions shown · non-contrast
Comparison: None.

CLINICAL DATA: 43-year-old male with abdominal pain and nausea

EXAM:
ABDOMEN - 1 VIEW

[abdomen kub]
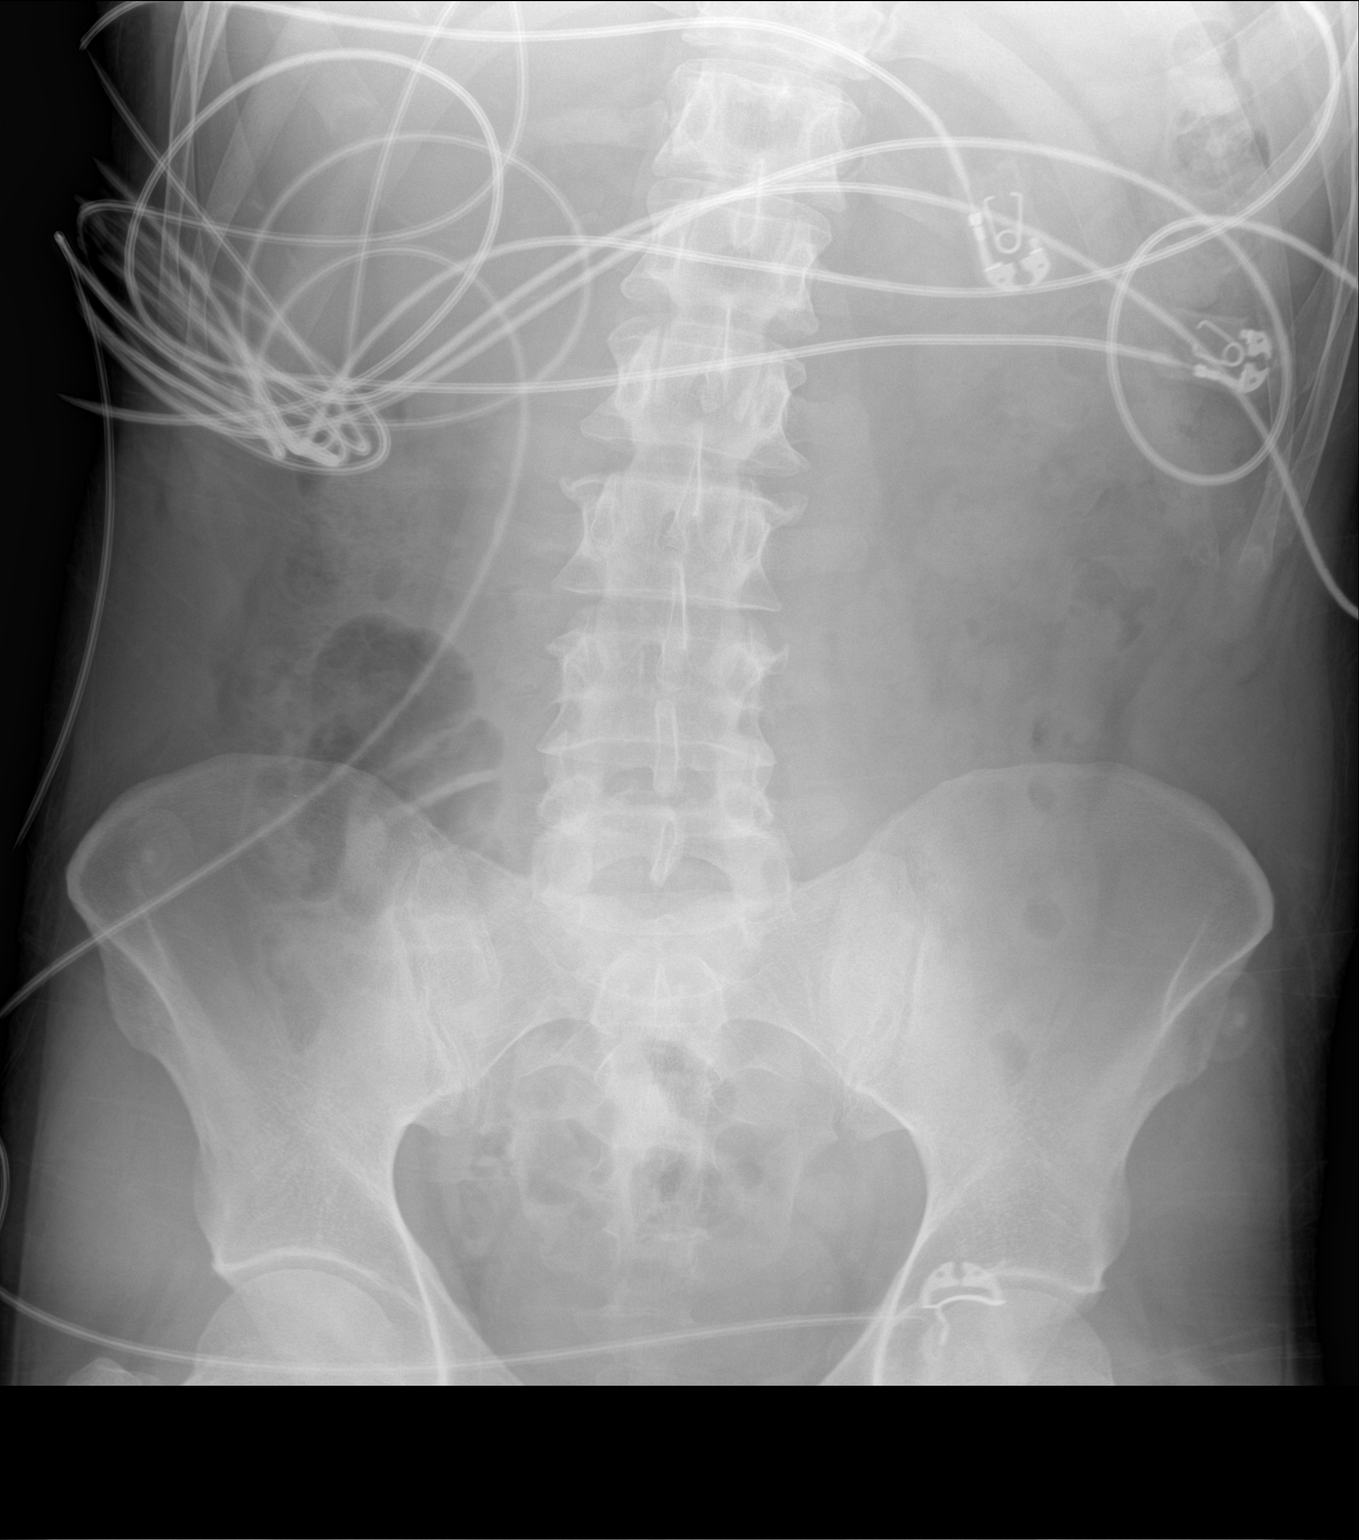

[1 of 1 positions shown; findings below may reference images not displayed]

FINDINGS: There is no bowel dilatation or evidence of obstruction. No free air
or radiopaque calculi. The osseous structures and soft tissues are
unremarkable.
IMPRESSION: Negative.

## 2023-08-09 ENCOUNTER — Ambulatory Visit (HOSPITAL_COMMUNITY)
Admission: EM | Admit: 2023-08-09 | Discharge: 2023-08-09 | Disposition: A | Discharge status: Home / Self Care | Payer: No Payment, Other

## 2023-08-09 NOTE — Progress Notes (Signed)
   08/09/23 1256  [facility] Triage Screening (Walk-ins at [facility] only)  How Did You Hear About Us ? Self  What Is the Reason for Your Visit/Call Today? Reginald Lawson presents to [facility] voluntarily unaccompanied. Pt decided to leave the facility before completing triage.  How Long Has This Been Causing You Problems?  (UTA)  Have You Recently Had Any Thoughts About Hurting Yourself?  (UTA)  Are You Planning to Commit Suicide/Harm Yourself At This time?  (UTA)  Have you Recently Had Thoughts About Hurting Someone Else?  (UTA)  Are You Planning To Harm Someone At This Time?  (UTA)  Physical Abuse  (UTA)  Verbal Abuse  (UTA)  Sexual Abuse  (UTA)  Exploitation of patient/patient's resources  (UTA)  Self-Neglect  (UTA)  Are you currently experiencing any auditory, visual or other hallucinations?  (UTA)  Have You Used Any Alcohol or Drugs in the Past 24 Hours?  (UTA)  Do you have any current medical co-morbidities that require immediate attention?  (UTA)  Clinician description of patient physical appearance/behavior:  (UTA)  What Do You Feel Would Help You the Most Today?  (UTA)  If access to [facility] was not available, would you have sought care in the Emergency Department?  (UTA)  Determination of Need  (UTA)  Options For Referral  (UTA)

## 2024-01-12 ENCOUNTER — Emergency Department (HOSPITAL_COMMUNITY)
Admission: EM | Admit: 2024-01-12 | Discharge: 2024-01-12 | Disposition: A | Discharge status: Home / Self Care | Payer: MEDICAID | Attending: Emergency Medicine | Admitting: Emergency Medicine

## 2024-01-12 ENCOUNTER — Encounter (HOSPITAL_COMMUNITY): Payer: Self-pay | Admitting: *Deleted

## 2024-01-12 ENCOUNTER — Other Ambulatory Visit: Payer: Self-pay

## 2024-01-12 DIAGNOSIS — M79671 Pain in right foot: Secondary | ICD-10-CM | POA: Diagnosis not present

## 2024-01-12 DIAGNOSIS — M79672 Pain in left foot: Secondary | ICD-10-CM | POA: Diagnosis not present

## 2024-01-12 DIAGNOSIS — R440 Auditory hallucinations: Secondary | ICD-10-CM | POA: Insufficient documentation

## 2024-01-12 DIAGNOSIS — Z87891 Personal history of nicotine dependence: Secondary | ICD-10-CM | POA: Diagnosis not present

## 2024-01-12 DIAGNOSIS — R2240 Localized swelling, mass and lump, unspecified lower limb: Secondary | ICD-10-CM | POA: Diagnosis present

## 2024-01-12 LAB — CBG MONITORING, ED: Glucose-Capillary: 120 mg/dL — ABNORMAL HIGH (ref 70–99)

## 2024-01-12 LAB — URINALYSIS, ROUTINE W REFLEX MICROSCOPIC
Bilirubin Urine: NEGATIVE
Glucose, UA: NEGATIVE mg/dL
Hgb urine dipstick: NEGATIVE
Ketones, ur: NEGATIVE mg/dL
Leukocytes,Ua: NEGATIVE
Nitrite: NEGATIVE
Protein, ur: NEGATIVE mg/dL
Specific Gravity, Urine: 1.016 (ref 1.005–1.030)
pH: 6 (ref 5.0–8.0)

## 2024-01-12 NOTE — ED Notes (Signed)
Pt. Given turkey sandwich and drink 

## 2024-01-12 NOTE — ED Provider Notes (Signed)
 [place] EMERGENCY DEPARTMENT AT [facility] Provider Note   CSN: [id number] Arrival date & time: 01/12/24  7829     History  Chief Complaint  Patient presents with   Foot Swelling    Reginald Lawson is a 48 y.o. male.  Reginald Lawson is a 48 year old male with history of being un-homed, schizoaffective disorder, tobacco and cocaine use disorder who presents with bilateral foot swelling and numbness.  Patient states the symptoms have been ongoing for the past few weeks without worsening.  Denies trauma or previous history with this. Endorses centralized chest pain with radiation to left shoulder for the past 3 days with associated shortness of breath.  Also endorses chronic cough for years and recent urine that looks like "applesauce".  No dysuria.         Home Medications Prior to Admission medications   Medication Sig Start Date End Date Taking? Authorizing Provider  OLANZapine  zydis (ZYPREXA ) 5 MG disintegrating tablet Take 1 tablet (5 mg total) by mouth 2 (two) times daily. 11/30/20   Tor Freed, FNP      Allergies    Patient has no known allergies.    Review of Systems   Review of Systems  Physical Exam Updated Vital Signs BP (!) 134/100 (BP Location: Right Arm)   Pulse (!) 59   Temp 98.4 F (36.9 C) (Oral)   Resp 17   Ht 5\' 10"  (1.778 m)   Wt 77.1 kg   SpO2 100%   BMI 24.39 kg/m  Physical Exam Constitutional:      Comments: Un-kept  HENT:     Head: Normocephalic.      Comments: Healing 5 cm laceration Cardiovascular:     Rate and Rhythm: Normal rate and regular rhythm.     Pulses: Normal pulses.     Heart sounds: Normal heart sounds.  Pulmonary:     Effort: Pulmonary effort is normal.     Breath sounds: Normal breath sounds.  Abdominal:     Palpations: Abdomen is soft.     Tenderness: There is no abdominal tenderness.  Skin:    General: Skin is warm.     Findings: Abrasion present.     Comments: Scattered cuts/scratches   Neurological:     Mental Status: He is alert.     Cranial Nerves: No dysarthria or facial asymmetry.     Motor: No weakness or atrophy.  Psychiatric:        Attention and Perception: He perceives auditory hallucinations.        Mood and Affect: Mood and affect normal.        Behavior: Behavior is cooperative.        Thought Content: Thought content is paranoid. Thought content does not include homicidal or suicidal ideation.     Comments: Inquires about hearing a ringing from people in the sky listening to him     ED Results / Procedures / Treatments   Labs (all labs ordered are listed, but only abnormal results are displayed) Labs Reviewed  CBG MONITORING, ED    EKG None  Radiology No results found.  Procedures Procedures    Medications Ordered in ED Medications - No data to display  ED Course/ Medical Decision Making/ A&P                                 Medical Decision Making Initial Impression: 48 year old male with chief complaint  of bilateral foot swelling and numbness.  During review of systems he endorses a few days of chest pain that comes and goes and thick urine along the same timeline. He is not actively having chest pain. He is inquiring about food/water  and a ride back to where he normally stays.  Overall low concern for emergent care need. Unremarkable physical exam in regards to lower extremity swelling.  Lungs sound good.  No fever.  Low concern for infectious process such as cellulitis. Will check ECG and UA. Will hand over care to supervising attending Dr. Colonel Dears.  Amount and/or Complexity of Data Reviewed Labs: ordered.    Final Clinical Impression(s) / ED Diagnoses Final diagnoses:  None    Rx / DC Orders ED Discharge Orders     None         Ronni Colace, DO 01/12/24 2125    Lowery Rue, DO 01/13/24 1108

## 2024-01-12 NOTE — ED Triage Notes (Signed)
 Pt states his feet are swelling with numbness in bottom of them. Pt noted to has slight edema in left ankle area, Symptoms have been for a while.

## 2024-01-12 NOTE — ED Provider Notes (Signed)
 Supervised resident visit.   Reginald Lawson is here mostly for pain in his feet.  Normal vitals.  No fever.  History of hepatitis C, homelessness, schizoaffective.  He is mostly asking me for food and a mode of transportation to get him back to the area where he has been staying.  On exam his feet are unremarkable.  He is strong pulses.  No signs of infectious process.  No major swelling.  No cellulitis.  He does have an ankle monitor on his left ankle.  Ultimately I think today visit is a social visit for some food and resources.  I do not appreciate any emergent process going on in his feet.  He describes maybe some chest pain off and on the last couple months but none currently.  He has normal vitals.  Will get EKG CBG let him eat and drink and try to arrange for transport to where he would like to go.  At this time I have no concern for ACS PE infectious process other emergent process at this time.  Does not appear to have any traumatic process in his lower legs as well.  This chart was dictated using voice recognition software.  Despite best efforts to proofread,  errors can occur which can change the documentation meaning.    Lowery Rue, DO 01/12/24 1004

## 2024-08-02 ENCOUNTER — Other Ambulatory Visit (HOSPITAL_COMMUNITY)
Admission: EM | Admit: 2024-08-02 | Discharge: 2024-08-02 | Disposition: A | Discharge status: Home / Self Care | Payer: MEDICAID

## 2024-08-02 DIAGNOSIS — F22 Delusional disorders: Secondary | ICD-10-CM | POA: Insufficient documentation

## 2024-08-02 DIAGNOSIS — F15159 Other stimulant abuse with stimulant-induced psychotic disorder, unspecified: Secondary | ICD-10-CM | POA: Diagnosis not present

## 2024-08-02 DIAGNOSIS — F17210 Nicotine dependence, cigarettes, uncomplicated: Secondary | ICD-10-CM | POA: Diagnosis not present

## 2024-08-02 DIAGNOSIS — F15959 Other stimulant use, unspecified with stimulant-induced psychotic disorder, unspecified: Secondary | ICD-10-CM | POA: Diagnosis not present

## 2024-08-02 LAB — POCT URINE DRUG SCREEN - MANUAL ENTRY (I-SCREEN)
POC Amphetamine UR: POSITIVE — AB
POC Buprenorphine (BUP): NOT DETECTED
POC Cocaine UR: NOT DETECTED
POC Marijuana UR: POSITIVE — AB
POC Methadone UR: NOT DETECTED
POC Methamphetamine UR: POSITIVE — AB
POC Morphine: NOT DETECTED
POC Oxazepam (BZO): NOT DETECTED
POC Oxycodone UR: NOT DETECTED
POC Secobarbital (BAR): NOT DETECTED

## 2024-08-02 MED ORDER — HALOPERIDOL LACTATE 5 MG/ML IJ SOLN: 10.0000 mg | Freq: Three times a day (TID) | INTRAMUSCULAR | Status: DC | PRN

## 2024-08-02 MED ORDER — ACETAMINOPHEN 325 MG PO TABS: 650.0000 mg | ORAL_TABLET | Freq: Four times a day (QID) | ORAL | Status: DC | PRN

## 2024-08-02 MED ORDER — DIPHENHYDRAMINE HCL 50 MG/ML IJ SOLN: 50.0000 mg | Freq: Three times a day (TID) | INTRAMUSCULAR | Status: DC | PRN

## 2024-08-02 MED ORDER — MAGNESIUM HYDROXIDE 400 MG/5ML PO SUSP: 30.0000 mL | Freq: Every day | ORAL | Status: DC | PRN

## 2024-08-02 MED ORDER — NICOTINE POLACRILEX 2 MG MT GUM: 2.0000 mg | CHEWING_GUM | OROMUCOSAL | Status: DC | PRN

## 2024-08-02 MED ORDER — ALUM & MAG HYDROXIDE-SIMETH 200-200-20 MG/5ML PO SUSP: 30.0000 mL | ORAL | Status: DC | PRN

## 2024-08-02 MED ORDER — HALOPERIDOL 5 MG PO TABS: 5.0000 mg | ORAL_TABLET | Freq: Three times a day (TID) | ORAL | Status: DC | PRN

## 2024-08-02 MED ORDER — DIPHENHYDRAMINE HCL 50 MG PO CAPS: 50.0000 mg | ORAL_CAPSULE | Freq: Three times a day (TID) | ORAL | Status: DC | PRN

## 2024-08-02 MED ORDER — LORAZEPAM 2 MG/ML IJ SOLN: 2.0000 mg | Freq: Three times a day (TID) | INTRAMUSCULAR | Status: DC | PRN

## 2024-08-02 MED ORDER — HYDROXYZINE HCL 25 MG PO TABS: 25.0000 mg | ORAL_TABLET | Freq: Three times a day (TID) | ORAL | Status: DC | PRN

## 2024-08-02 MED ORDER — TRAZODONE HCL 50 MG PO TABS: 50.0000 mg | ORAL_TABLET | Freq: Every evening | ORAL | Status: DC | PRN

## 2024-08-02 MED ORDER — HALOPERIDOL LACTATE 5 MG/ML IJ SOLN: 5.0000 mg | Freq: Three times a day (TID) | INTRAMUSCULAR | Status: DC | PRN

## 2024-08-02 NOTE — ED Provider Notes (Signed)
 Explained and warned patient of the dangers of leaving prematurely before observation was completed and while patient is continuing to have delusions. Pt denies that he is a danger to himself or others. He states that he has a place to go and gives me collateral numbers but no one is reachable. He likely has methamphetamine induced psychotic symptoms and is in a  contemplative stage of change. Continued use is unhealthy for patient and there are risks involved in continued course of behavior but pt reports that he has things he needs to take care of and that he can find his way to take care of these. He denies a history of self harm or harm to others. There is not enough evidence to keep patient involuntarily and he presents some safety concerns to staff due to volatile interactions on the unit recently. He woke up from a nap demanding to leave. Pt was admitted voluntarily. He says that he has come and left before under similar circumstances which was verified upon review of the records. Pt reports he will accept the risk of premature discharge. He is impatient and only focused on discharging. He does not endorse delusional thinking at this time. He appears emaciated and has a scar on his forehead.  Will discharge patient against medical advice. Although pt is making poor decisions there is not enough evidence to believe he will harm himself or others.

## 2024-08-02 NOTE — ED Notes (Signed)
 Patient walked into facility voluntarily and reports hearing voices.  He denies shi or plan.  He is calm and cooperative with admission process.  Patient positive for methamphetamines and thc.  Patient disheveled and his clothes were unwashed.  Patient given lunch.  Will monitor and provide safe environment.

## 2024-08-02 NOTE — BH Assessment (Signed)
 Comprehensive Clinical Assessment (CCA) Note  08/02/2024 Reginald Lawson [id number]  Disposition: Per Reginald Snell  NP, Patient is recommended for inpatient treatment .  The patient demonstrates the following risk factors for suicide: Chronic risk factors for suicide include: psychiatric disorder of paranoid schizophrenia, substance use disorder, and previous suicide attempts 4-5 years ago. Acute risk factors for suicide include: unemployment. Protective factors for this patient include: hope for the future. Considering these factors, the overall suicide risk at this point appears to be low. Patient is appropriate for outpatient follow up.  Patient is a 48 year old male who presents voluntarily to [facility] unaccompanied. At the time off assessment, patient denies SI HI or visual hallucinations.  Patient reports he is here to help with the voices.  Patient states voices are telling him that he is under examination by satellite.  Patient endorses diagnosis of paranoid schizophrenia and anxiety.  Patient is not on any medications and does not see a veterinary surgeon.  He also endorses multiple inpatient hospitalizations the last 1 being 4 to 5 years ago.  Although patient currently denies any SI he endorses history of multiple previous suicide attempts.  He states that he has had approximately 4-5 attempts in the past 5 years.  He states he is taking up a lot of pills, attempted to suffocate himself using carbon monoxide as well as shooting himself in his thigh.    Patient endorses methamphetamine use as well as THC and occasional alcohol use.  Patient reports 26-year-old male 2-3 times per week and snorts about a gram per use.  Patient also reports occasional use of marijuana approximately once a week.  He also reports that he drinks alcohol about 1/2 pint per week.  Patient is poor historian and is unable to accurately report the amount and frequency of his substance use.  Patient denies any substance use problems and  denies wanting to be treated for substance use.  He states his only problem is the voices and is willing to try medication to help stabilize his mental health.  Patient reports that he lives alone with no support system and at this time is unemployed.  During assessment patient is sitting in chair casually dressed but very.  Patient was alert and oriented x 4.  Patient's  mood was anxious and depressed with congruent affect.  Patient's speech was clear and coherent with normal tone and volume.  Patient's thought process was coherent and relevant.  Patient was cooperative throughout assessment   Chief Complaint: Delusional  Visit Diagnosis:  Methamphetamine-induced psychotic disorder (HCC)    CCA Screening, Triage and Referral (STR)  Patient Reported Information How did you hear about us ? Self  What Is the Reason for Your Visit/Call Today? Patient is a 48 year old male who presents voluntarily to [facility] unaccompanied.  Patient denies SI HI or visual hallucinations.  Patient reports he is here to help with the voices.  Patient states voices are telling him that he is under examination by satellite.  Patient endorses diagnosis of paranoid schizophrenia and anxiety.  Patient is not on any medications and does not see a veterinary surgeon.  He also endorses multiple inpatient hospitalizations the last 1 being 4 to 5 years ago.  Patient also reports multiple suicide attempts the last 1 being approximately 4 years ago.  He states he has tried to take pills, poison himself with carbon monoxide to the exhaust system in the car, and he shot himself in the leg.  Patient states he is not sure what would help  him but he would like to have the voices addressed.  How Long Has This Been Causing You Problems? 1 wk - 1 month  What Do You Feel Would Help You the Most Today? Treatment for Depression or other mood problem; Medication(s)   Have You Recently Had Any Thoughts About Hurting Yourself? No  Are You Planning to  Commit Suicide/Harm Yourself At This time? No   Flowsheet Row ED from 08/02/2024 in [facility] ED from 01/12/2024 in [facility] Emergency Department at [facility] ED from 11/29/2020 in [facility]  C-SSRS RISK CATEGORY No Risk No Risk No Risk    Have you Recently Had Thoughts About Hurting Someone [place]? No  Are You Planning to Harm Someone at This Time? No  Explanation: N/A  Have You Used Any Alcohol or Drugs in the Past 24 Hours? Yes  How Long Ago Did You Use Drugs or Alcohol? Last Night What Did You Use and How Much? Patient reports using meth on last night about a gram   Do You Currently Have a Therapist/Psychiatrist? No  Name of Therapist/Psychiatrist:    Have You Been Recently Discharged From Any Office Practice or Programs? No  Explanation of Discharge From Practice/Program: N/A   CCA Screening Triage Referral Assessment Type of Contact: Face-to-Face  Telemedicine Service Delivery:   Is this Initial or Reassessment?   Date Telepsych consult ordered in [facility]:    Time Telepsych consult ordered in [facility]:    Location of Assessment: [facility] Assessment Services  Provider Location: [facility] Assessment Services   Collateral Involvement: None   Does Patient Have a Automotive Engineer Guardian? No  Legal Guardian Contact Information: N/A  Copy of Legal Guardianship Form: -- (N/A)  Legal Guardian Notified of Arrival: -- (N/A)  Legal Guardian Notified of Pending Discharge: -- (N/A)  If Minor and Not Living with Parent(s), Who has Custody? n/A  Is CPS involved or ever been involved? Never  Is APS involved or ever been involved? Never   Patient Determined To Be At Risk for Harm To Self or Others Based on Review of Patient Reported Information or Presenting Complaint? No  Method: No Plan  Availability of Means: No access or NA  Intent: Vague intent or NA  Notification Required: No need or  identified person  Additional Information for Danger to Others Potential: -- (None)  Additional Comments for Danger to Others Potential: none  Are There Guns or Other Weapons in Your Home? No  Types of Guns/Weapons: Pt denies guns in he home  Are These Weapons Safely Secured?                            -- (N/A)  Who Could Verify You Are Able To Have These Secured: N/A  Do You Have any Outstanding Charges, Pending Court Dates, Parole/Probation? None at this time  Contacted To Inform of Risk of Harm To Self or Others: -- (N/A)    Does Patient Present under Involuntary Commitment? No    [place] of Residence: [place]   Patient Currently Receiving the Following Services: Not Receiving Services   Determination of Need: Urgent (48 hours)   Options For Referral: Medication Management; Inpatient Hospitalization     CCA Biopsychosocial Patient Reported Schizophrenia/Schizoaffective Diagnosis in Past: Yes  Strengths: Pt is seeking services for mental health   Mental Health Symptoms Depression:  Change in energy/activity; Difficulty Concentrating; Sleep (too much  or little)   Duration of Depressive symptoms: Duration of Depressive Symptoms: Greater than two weeks   Mania:  Change in energy/activity; Racing thoughts   Anxiety:   Worrying; Fatigue; Sleep   Psychosis:  Hallucinations; Delusions   Duration of Psychotic symptoms: Duration of Psychotic Symptoms: Greater than six months   Trauma:  N/A   Obsessions:  Recurrent & persistent thoughts/impulses/images; Cause anxiety   Compulsions:  N/A   Inattention:  N/A   Hyperactivity/Impulsivity:  N/A   Oppositional/Defiant Behaviors:  N/A   Emotional Irregularity:  Chronic feelings of emptiness; Intense/inappropriate anger; Recurrent suicidal behaviors/gestures/threats; Transient, stress-related paranoia/disassociation   Other Mood/Personality Symptoms:  None    Mental Status Exam Appearance and self-care   Stature:  Average   Weight:  Average weight   Clothing:  Casual; Dirty   Grooming:  Normal   Cosmetic use:  None   Posture/gait:  Normal   Motor activity:  Not remarkable  Sensorium  Attention:  Normal   Concentration:  Preoccupied   Orientation:  Situation; Place; Object   Recall/memory:  Defective in Short-term, Defective in Remote  Affect and Mood  Affect:  Anxious   Mood:  Anxious, Depressed  Relating  Eye contact:  Normal   Facial expression:  Responsive   Attitude toward examiner:  Cooperative   Thought and Language  Speech flow: Normal   Thought content:  Delusions; Suspicious   Preoccupation:  Other (Comment) (paranoia)   Hallucinations:  Auditory   Organization:  Patent Attorney of Knowledge:  Fair   Intelligence:  Average   Abstraction:  Functional   Judgement:  Fair   Dance Movement Psychotherapist:  Distorted   Insight:  Fair   Decision Making:  Only simple   Social Functioning  Social Maturity:  Isolates   Social Judgement:  Chief Of Staff   Stress  Stressors:  Surveyor, Quantity; Transitions   Coping Ability:  Human Resources Officer Deficits:  Scientist, physiological; Self-control   Supports:  Support needed     Religion: Religion/Spirituality Are You A Religious Person?: No How Might This Affect Treatment?: N/A  Leisure/Recreation: Leisure / Recreation Do You Have Hobbies?: Yes Leisure and Hobbies: Statistician pool  Exercise/Diet: Exercise/Diet Do You Exercise?: No Have You Gained or Lost A Significant Amount of Weight in the Past Six Months?: No Do You Follow a Special Diet?: No Do You Have Any Trouble Sleeping?: Yes   CCA Employment/Education Employment/Work Situation: Employment / Work Situation Employment Situation: Unemployed Patient's Job has Been Impacted by Current Illness: No Has Patient ever Been in Equities Trader?: No  Education: Education Is Patient Currently Attending School?: No Last Grade Completed:  12 Did You Product Manager?: Yes What Type of College Degree Do you Have?: At[employer]  did not complete Did You Have An Individualized Education Program (IIEP): No Did You Have Any Difficulty At School?: No Patient's Education Has Been Impacted by Current Illness: No   CCA Family/Childhood History Family and Relationship History: Family history Marital status: Single Does patient have children?: No  Childhood History:  Childhood History By whom was/is the patient raised?: Both parents Did patient suffer any verbal/emotional/physical/sexual abuse as a child?: No Did patient suffer from severe childhood neglect?: No Has patient ever been sexually abused/assaulted/raped as an adolescent or adult?: No Was the patient ever a victim of a crime or a disaster?: No Witnessed domestic violence?: No Has patient been affected by domestic violence as an adult?: No  CCA Substance Use Alcohol/Drug Use: Alcohol / Drug Use Pain Medications: See MAR Prescriptions: See MAR Over the Counter: See MAR History of alcohol / drug use?: Yes Longest period of sobriety (when/how long): Unknown Negative Consequences of Use:  (UTA) Withdrawal Symptoms: None Substance #1 Name of Substance 1: Methamphetamines 1 - Age of First Use: 40 1 - Amount (size/oz): 1 gram 1 - Frequency: 2-3 days 1 - Duration: ongoin 1 - Last Use / Amount: last evening 1 - Method of Aquiring: purchase/friends 1- Route of Use: snort Substance #2 Name of Substance 2: Marijuana 2 - Age of First Use: 48 y.o 2 - Amount (size/oz): Varies 2 - Frequency: varies 2 - Duration: ongoing 2 - Last Use / Amount: last weeek, unable to recall exact day or date 2 - Method of Aquiring: purchase/friend 2 - Route of Substance Use: smoke Substance #3 Name of Substance 3: Alcohol 3 - Age of First Use: 14 3 - Amount (size/oz): 1/2 pint 3 - Frequency: 1 x per week 3 - Duration: ongoing 3 - Last Use / Amount: 1 week ago 3 - Method of  Aquiring: puchase 3 - Route of Substance Use: drinkds                   ASAM's:  Six Dimensions of Multidimensional Assessment  Dimension 1:  Acute Intoxication and/or Withdrawal Potential:   Dimension 1:  Description of individual's past and current experiences of substance use and withdrawal: Patient was incarcerated for 29 months and did not have access  Dimension 2:  Biomedical Conditions and Complications:   Dimension 2:  Description of patient's biomedical conditions and  complications: Pt has chronic Hepatitas C  Dimension 3:  Emotional, Behavioral, or Cognitive Conditions and Complications:  Dimension 3:  Description of emotional, behavioral, or cognitive conditions and complications: pt hs diagnosis of paranoid shchizophrenia an d is often unable to distinquish reality from  his delusions.  Dimension 4:  Readiness to Change:  Dimension 4:  Description of Readiness to Change criteria: Pt does not want help with substance use at this time on wtih his hallucinations  Dimension 5:  Relapse, Continued use, or Continued Problem Potential:  Dimension 5:  Relapse, continued use, or continued problem potential critiera description: Patient was able to go 29 months without using due to lack of access however he is refusing assistance at this time for substance use  Dimension 6:  Recovery/Living Environment:  Dimension 6:  Recovery/Iiving environment criteria description: Patient lives alone, without any family or other support  ASAM Severity Score: ASAM's Severity Rating Score: 16  ASAM Recommended Level of Treatment: ASAM Recommended Level of Treatment: Level II Partial Hospitalization Treatment   Substance use Disorder (SUD) Substance Use Disorder (SUD)  Checklist Symptoms of Substance Use: Evidence of tolerance, Presence of craving or strong urge to use  Recommendations for Services/Supports/Treatments: Recommendations for Services/Supports/Treatments Recommendations For  Services/Supports/Treatments: Individual Therapy, Medication Management, Other (Comment), Inpatient Hospitalization  Disposition Recommendation per psychiatric provider: We recommend inpatient psychiatric hospitalization when medically cleared. Patient is under voluntary admission status at this time; please IVC if attempts to leave hospital.   DSM5 Diagnoses: Patient Active Problem List   Diagnosis Date Noted   Methamphetamine-induced psychotic disorder (HCC) 08/02/2024   Chronic hepatitis C without hepatic coma (HCC) 02/12/2020   Uninsured 02/12/2020   Uncomplicated alcohol dependence (HCC) 12/21/2019   Other stimulant dependence, uncomplicated (HCC) 12/21/2019   Schizoaffective disorder, depressive type (HCC) 12/21/2019   Oth stimulant depend w  stim-induce psychotic disorder, unsp (HCC) 12/21/2019     Referrals to Alternative Service(s): Referred to Alternative Service(s):   Place:   Date:   Time:    Referred to Alternative Service(s):   Place:   Date:   Time:    Referred to Alternative Service(s):   Place:   Date:   Time:    Referred to Alternative Service(s):   Place:   Date:   Time:     Lianne JINNY Shuck, LCSW
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
## 2024-08-03 LAB — URINALYSIS, ROUTINE W REFLEX MICROSCOPIC
Bilirubin Urine: NEGATIVE
Glucose, UA: NEGATIVE mg/dL
Hgb urine dipstick: NEGATIVE
Ketones, ur: NEGATIVE mg/dL
Leukocytes,Ua: NEGATIVE
Nitrite: NEGATIVE
Protein, ur: NEGATIVE mg/dL
Specific Gravity, Urine: 1.02 (ref 1.005–1.030)
pH: 7 (ref 5.0–8.0)
